# Patient Record
Sex: Female | Born: 1970 | Race: White | Hispanic: No | Marital: Married | Smoking: Never smoker
Health system: Southern US, Community
[De-identification: ages and names within clinical notes are randomized; demographics above are authoritative.]

## PROBLEM LIST (undated history)

## (undated) HISTORY — PX: INTRAUTERINE DEVICE INSERTION: SHX323

---

## 2002-01-07 ENCOUNTER — Inpatient Hospital Stay (HOSPITAL_COMMUNITY): Admission: AD | Admit: 2002-01-07 | Discharge: 2002-01-09 | Payer: Self-pay | Admitting: Gynecology

## 2002-02-17 ENCOUNTER — Other Ambulatory Visit: Admission: RE | Admit: 2002-02-17 | Discharge: 2002-02-17 | Payer: Self-pay | Admitting: *Deleted

## 2003-02-19 ENCOUNTER — Other Ambulatory Visit: Admission: RE | Admit: 2003-02-19 | Discharge: 2003-02-19 | Payer: Self-pay | Admitting: Gynecology

## 2004-02-27 ENCOUNTER — Other Ambulatory Visit: Admission: RE | Admit: 2004-02-27 | Discharge: 2004-02-27 | Payer: Self-pay | Admitting: Gynecology

## 2004-12-08 ENCOUNTER — Inpatient Hospital Stay (HOSPITAL_COMMUNITY): Admission: AD | Admit: 2004-12-08 | Discharge: 2004-12-10 | Payer: Self-pay | Admitting: Gynecology

## 2005-02-03 ENCOUNTER — Other Ambulatory Visit: Admission: RE | Admit: 2005-02-03 | Discharge: 2005-02-03 | Payer: Self-pay | Admitting: Gynecology

## 2006-02-04 ENCOUNTER — Other Ambulatory Visit: Admission: RE | Admit: 2006-02-04 | Discharge: 2006-02-04 | Payer: Self-pay | Admitting: Gynecology

## 2007-02-23 ENCOUNTER — Other Ambulatory Visit: Admission: RE | Admit: 2007-02-23 | Discharge: 2007-02-23 | Payer: Self-pay | Admitting: Gynecology

## 2008-02-28 ENCOUNTER — Other Ambulatory Visit: Admission: RE | Admit: 2008-02-28 | Discharge: 2008-02-28 | Payer: Self-pay | Admitting: Gynecology

## 2008-02-28 ENCOUNTER — Encounter: Payer: Self-pay | Admitting: Gynecology

## 2008-02-28 ENCOUNTER — Ambulatory Visit: Payer: Self-pay | Admitting: Gynecology

## 2008-11-29 ENCOUNTER — Ambulatory Visit: Payer: Self-pay | Admitting: Gynecology

## 2009-02-28 ENCOUNTER — Other Ambulatory Visit: Admission: RE | Admit: 2009-02-28 | Discharge: 2009-02-28 | Payer: Self-pay | Admitting: Gynecology

## 2009-02-28 ENCOUNTER — Encounter: Payer: Self-pay | Admitting: Gynecology

## 2009-02-28 ENCOUNTER — Ambulatory Visit: Payer: Self-pay | Admitting: Gynecology

## 2010-05-30 ENCOUNTER — Ambulatory Visit: Payer: Self-pay | Admitting: Gynecology

## 2010-05-30 ENCOUNTER — Other Ambulatory Visit
Admission: RE | Admit: 2010-05-30 | Discharge: 2010-05-30 | Payer: Self-pay | Source: Home / Self Care | Admitting: Gynecology

## 2010-11-07 NOTE — Discharge Summary (Signed)
   NAME:  [name], [name]                      ACCOUNT NO.:  [id number]   MEDICAL RECORD NO.:  [id number]                   PATIENT TYPE:  INP   LOCATION:  9125                                 FACILITY:  [facility]   PHYSICIAN:  Susa Loffler, P.A.              DATE OF BIRTH:  21-Feb-1971   DATE OF ADMISSION:  01/07/2002  DATE OF DISCHARGE:  01/09/2002                                 DISCHARGE SUMMARY   DISCHARGE DIAGNOSES:  Intrauterine pregnancy 39 weeks, delivered, thick  meconium, outlet forceps assisted vaginal delivery.   HISTORY OF PRESENT ILLNESS:  A 40 year old female gravida 2, para 0, aborta  1 with an EDC of January 14, 2002.  Prenatal course has been uncomplicated.   HOSPITAL COURSE:  On January 07, 2002 patient was admitted in labor and on January 07, 2002 at 1918 underwent an outlet forceps assisted vaginal delivery with  second degree midline episiotomy secondary to thick meconium and  nonreassuring fetal heart rate tracing.  It was noted there was a mild  caput.  There was a third degree with extension repaired.  Postpartum  patient did have some slight elevation of temperature but she was monitored  with no evidence of endometritis.  Therefore, patient was discharged to home  on January 09, 2002 in stable condition, voiding, afebrile.  She was given  [facility] Gynecologic postpartum surgical and postpartum booklet.  Subsequent __________ labs:  Patient's hemoglobin on January 08, 2002 was 9.2,  hematocrit 26.3.   DISCHARGE INSTRUCTIONS:  The patient was discharged to home.  Follow up in  six weeks.                                               Susa Loffler, P.A.    TSG/MEDQ  D:  02/03/2002  T:  02/03/2002  Job:  [id number]

## 2010-11-07 NOTE — H&P (Signed)
[facility]  Patient:    [name], [name] Visit Number: [id number] MRN: [id number]          Service Type: OBS Location: 910B [id number] [id number] Attending Physician:  Wetzel Bjornstad Dictated by:   Katy Fitch, M.D. Admit Date:  01/07/2002                           History and Physical  CHIEF COMPLAINT:              Labor.  HISTORY OF PRESENT ILLNESS:   This patient is a 40 year old, G2, P0, at 39 weeks, who presents to triage complaining of labor for the past three hours. The patient is noted to be intact and contracting every five minutes and noted to be 4 cm, 100%, and -1 station.  Heart tracings noted to be reactive.  The patient had late prenatal care with the practice; however, was taken care of in [place] prior to her moving.  The patients prenatal labs were normal.  PAST MEDICAL HISTORY:         None.  PAST SURGICAL HISTORY:        None.  MEDICATIONS:                  Prenatal vitamins.  ALLERGIES:                    None.  SOCIAL HISTORY:               Without any tobacco, alcohol, or drugs.  FAMILY HISTORY:               Without any mental retardation.  PHYSICAL EXAMINATION:  VITAL SIGNS:                  Blood pressure is 123/78.  HEENT:                        Sclerae clear.  LUNGS:                        Clear to auscultation bilaterally.  HEART:                        Regular rate and rhythm.  ABDOMEN:                      Gravid and nontender.  Estimated fetal weight 7 pounds 12 ounces.  EXTREMITIES:                  Without any edema, clubbing, cyanosis, or tenderness.  PELVIC:                       Cervix in OB room, 8, 100%, and 0 station.  Took ______.  Fetal heart tones 130s and reactive.  ASSESSMENT AND PLAN:          1. Artificial rupture of membranes with                                  meconium.                               2. Intrauterine pressure catheter placed with  amnioinfusion  300 cc bolus with 150 an hour.                               3. Epidural in place.                               4. Anticipate spontaneous vaginal delivery. Dictated by:   Katy Fitch, M.D. Attending Physician:  Wetzel Bjornstad DD:  01/07/02 TD:  01/07/02 Job: [id number]

## 2010-11-07 NOTE — H&P (Signed)
[name], [name]            ACCOUNT NO.:  [id number]   MEDICAL RECORD NO.:  [id number]          PATIENT TYPE:  INP   LOCATION:  9165                          FACILITY:  [facility]   PHYSICIAN:  Juan H. Lily Peer, M.D.DATE OF BIRTH:  05-26-1971   DATE OF ADMISSION:  12/08/2004  DATE OF DISCHARGE:                                HISTORY & PHYSICAL   CHIEF COMPLAINT:  Term intrauterine pregnancy, 37 to 58 weeks estimated  gestational age with past pregnancy history fetal macrosomia.   HISTORY OF PRESENT ILLNESS:  The patient is a 40 year old gravida 2, para 1  at 37-1/2 weeks estimated gestational age who is being admitted for elective  induction due to the fact that she has a favorable cervix in the office  where she was 2 to 3 cm dilated, about 80% effaced and also patient with  previous pregnancy with fetal macrosomia at 9-1/2 pounds at [redacted] weeks  gestation.   OBSTETRICAL HISTORY:  The patient's prenatal course has essentially been  unremarkable although she did decline any cystic fibrosis screening or  maternal serum alpha fetoprotein screening.  She had normal diabetes  screening and had gained approximately 20 pounds in her pregnancy.   PAST MEDICAL HISTORY:  Patient with prior normal spontaneous vaginal  delivery in July 2003, 9-1/2 pounds, female at [redacted] weeks gestation.  Otherwise patient has been healthy with no reported medical problems.   ALLERGIES:  Patient denies any allergies.   REVIEW OF SYMPTOMS:  See [employer] form.   PHYSICAL EXAMINATION:  GENERAL APPEARANCE:  Well-developed, well-nourished  female.  HEENT:  Unremarkable.  NECK:  Supple. Trachea midline.  No carotid bruits and no thyromegaly.  LUNGS:  Are clear to auscultation without rhonchi or wheezes.  HEART:  Regular rate and rhythm without murmurs, rubs or gallops.  BREAST:  Examination not done.  ABDOMEN:  Gravid uterus, vertex presentation by [employer].  PELVIC:  Cervix 4+ cm. 80 to 90% effaced.   Minus 2 station.  Intact  membrane.  EXTREMITIES:  Deep tendon reflexes 1+. Negative clonus   LABORATORY DATA:  Prenatal labs show O positive blood type, negative  antibody screen.  VDRL nonreactive. Rubella immune. Hepatitis B surface  antigen and HIV were negative.  Pap smear was normal.  Diabetes screen was  normal.  Declined cystic fibrosis screening.  Declined serum alpha  fetoprotein.  GBS culture was negative.   ASSESSMENT:  40 year old gravida 2, para 1 at 37-1/2 weeks estimated  gestational age with advanced cervical dilatation and favorable cervix is  admitted for induction.  Patient with prior pregnancy at [redacted] weeks gestation  who delivered a 9-1/2 pound baby.  Patient was anxious and apprehensive and  concerned about a fetal macrosomia with this pregnancy and since she has a  favorable cervix she was brought in for induction.  This morning she was  started on Pitocin.  She is currently on 4 mL international units of  Pitocin, contacting every two to three minutes apart with a reassuring fetal  heart rate tracing.  She underwent artificial rupture of membranes with  clear amniotic pressure.  Intrauterine pressure  catheter and fetal scalp electrode were placed.  Patient will receive an epidural for pain relief and anticipate a vaginal  delivery.  Her vital signs today were temperature 98.3 and blood pressure  104/72.   PLAN:  As per assessment above.       JHF/MEDQ  D:  12/08/2004  T:  12/08/2004  Job:  [id number]

## 2011-01-29 DIAGNOSIS — L723 Sebaceous cyst: Secondary | ICD-10-CM | POA: Insufficient documentation

## 2011-10-26 ENCOUNTER — Encounter: Payer: Self-pay | Admitting: *Deleted

## 2011-11-02 ENCOUNTER — Encounter: Payer: Self-pay | Admitting: Gynecology

## 2011-11-02 ENCOUNTER — Other Ambulatory Visit (HOSPITAL_COMMUNITY)
Admission: RE | Admit: 2011-11-02 | Discharge: 2011-11-02 | Disposition: A | Discharge status: Home / Self Care | Payer: BC Managed Care – PPO | Source: Ambulatory Visit | Attending: Gynecology | Admitting: Gynecology

## 2011-11-02 ENCOUNTER — Ambulatory Visit (INDEPENDENT_AMBULATORY_CARE_PROVIDER_SITE_OTHER): Payer: BC Managed Care – PPO | Admitting: Gynecology

## 2011-11-02 VITALS — BP 104/64 | Ht 65.0 in | Wt 135.0 lb

## 2011-11-02 DIAGNOSIS — Z01419 Encounter for gynecological examination (general) (routine) without abnormal findings: Secondary | ICD-10-CM

## 2011-11-02 DIAGNOSIS — Z13220 Encounter for screening for lipoid disorders: Secondary | ICD-10-CM

## 2011-11-02 DIAGNOSIS — Z131 Encounter for screening for diabetes mellitus: Secondary | ICD-10-CM

## 2011-11-02 DIAGNOSIS — Z1159 Encounter for screening for other viral diseases: Secondary | ICD-10-CM | POA: Insufficient documentation

## 2011-11-02 LAB — LIPID PANEL
LDL Cholesterol: 107 mg/dL — ABNORMAL HIGH (ref 0–99)
Triglycerides: 66 mg/dL (ref <150)
VLDL: 13 mg/dL (ref 0–40)

## 2011-11-02 LAB — CBC WITH DIFFERENTIAL/PLATELET
Basophils Absolute: 0 10*3/uL (ref 0.0–0.1)
Basophils Relative: 0 % (ref 0–1)
Eosinophils Absolute: 0 10*3/uL (ref 0.0–0.7)
Eosinophils Relative: 1 % (ref 0–5)
HCT: 40.4 % (ref 36.0–46.0)
MCHC: 32.9 g/dL (ref 30.0–36.0)
MCV: 97.6 fL (ref 78.0–100.0)
Monocytes Absolute: 0.3 10*3/uL (ref 0.1–1.0)
RDW: 13 % (ref 11.5–15.5)

## 2011-11-02 NOTE — Progress Notes (Signed)
KYRI SHADER 1970-08-05 [id number]        41 y.o.  for annual exam.  Doing well.  Past medical history,surgical history, medications, allergies, family history and social history were all reviewed and documented in the EPIC chart. ROS:  Was performed and pertinent positives and negatives are included in the history.  Exam: Sherrilyn Rist chaperone present Filed Vitals:   11/02/11 0919  BP: 104/64   General appearance  Normal Skin grossly normal Head/Neck normal with no cervical or supraclavicular adenopathy thyroid normal Lungs  clear Cardiac RR, without RMG Abdominal  soft, nontender, without masses, organomegaly or hernia Breasts  examined lying and sitting without masses, retractions, discharge or axillary adenopathy. Pelvic  Ext/BUS/vagina  normal   Cervix  normal IUD string visualized, Pap done  Uterus  anteverted, normal size, shape and contour, midline and mobile nontender   Adnexa  Without masses or tenderness    Anus and perineum  normal   Rectovaginal  normal sphincter tone without palpated masses or tenderness.    Assessment/Plan:  41 y.o. female for annual exam.    1. Mirena IUD. Placed October 2008. Due to be replaced this coming fall. I reminded the patient to schedule this. Having light regular menses. IUD string visualized. 2. Pap smear. I did a Pap with high-risk HPV screen. Assuming negative, with no history of abnormal Pap smears before, we will plan every 5 year screening. 3. Breast health. SBE monthly reviewed. Screening mammogram recommendations between 35 and 40 reviewed. She has no strong family history and prefers to wait closer to 40. 4. Health maintenance. Baseline CBC lipid profile glucose urinalysis ordered.    Dara Lords MD, 9:43 AM 11/02/2011

## 2011-11-02 NOTE — Patient Instructions (Signed)
Returned by October 2013 for IUD replacement.

## 2011-11-03 LAB — URINALYSIS W MICROSCOPIC + REFLEX CULTURE
Bacteria, UA: NONE SEEN
Crystals: NONE SEEN
Ketones, ur: NEGATIVE mg/dL
Nitrite: NEGATIVE
Specific Gravity, Urine: 1.023 (ref 1.005–1.030)
Urobilinogen, UA: 0.2 mg/dL (ref 0.0–1.0)

## 2012-05-22 HISTORY — PX: INTRAUTERINE DEVICE INSERTION: SHX323

## 2012-06-07 ENCOUNTER — Telehealth: Payer: Self-pay | Admitting: Gynecology

## 2012-06-07 ENCOUNTER — Other Ambulatory Visit: Payer: Self-pay | Admitting: Gynecology

## 2012-06-07 DIAGNOSIS — Z3049 Encounter for surveillance of other contraceptives: Secondary | ICD-10-CM

## 2012-06-07 MED ORDER — LEVONORGESTREL 20 MCG/24HR IU IUD: INTRAUTERINE_SYSTEM | Freq: Once | INTRAUTERINE | Status: DC

## 2012-06-07 NOTE — Telephone Encounter (Signed)
Pt was advised that the removal and insertion of new Mirena is covd at 100% by her ins,no copay. She has appt with TF on 06/09/12/WL

## 2012-06-09 ENCOUNTER — Encounter: Payer: Self-pay | Admitting: Gynecology

## 2012-06-09 ENCOUNTER — Ambulatory Visit (INDEPENDENT_AMBULATORY_CARE_PROVIDER_SITE_OTHER): Payer: BC Managed Care – PPO | Admitting: Gynecology

## 2012-06-09 DIAGNOSIS — Z30431 Encounter for routine checking of intrauterine contraceptive device: Secondary | ICD-10-CM

## 2012-06-09 MED ORDER — LEVONORGESTREL 20 MCG/24HR IU IUD: INTRAUTERINE_SYSTEM | Freq: Once | INTRAUTERINE | Status: DC

## 2012-06-09 NOTE — Patient Instructions (Signed)
Intrauterine Device Insertion Most often, an intrauterine device (IUD) is inserted into the uterus to prevent pregnancy. There are 2 types of IUDs available:  Copper IUD. This type of IUD creates an environment that is not favorable to sperm survival. The mechanism of action of the copper IUD is not known for certain. It can stay in place for 10 years.  Hormone IUD. This type of IUD contains the hormone progestin (synthetic progesterone). The progestin thickens the cervical mucus and prevents sperm from entering the uterus, and it also thins the uterine lining. There is no evidence that the hormone IUD prevents implantation. The hormone IUD can stay in place for up to 5 years. An IUD is the most cost-effective birth control if left in place for the full duration. It may be removed at any time. LET YOUR CAREGIVER KNOW ABOUT:  Sensitivity to metals.  Medicines taken including herbs, eyedrops, over-the-counter medicines, and creams.  Use of steroids (by mouth or creams).  Previous problems with anesthetics or numbing medicine.  Previous gynecological surgery.  History of blood clots or clotting disorders.  Possibility of pregnancy.  Menstrual irregularities.  Concerns regarding unusual vaginal discharge or odors.  Previous experience with an IUD.  Other health problems. RISKS AND COMPLICATIONS  Accidental puncture (perforation) of the uterus.  Accidental placement of the IUD either in the muscle layer of the uterus (myometrium) or outside the uterus. If this happen, the IUD can be found essentially floating around the bowels. When this happens, the IUD must be taken out surgically.  The IUD may fall out of the uterus (expulsion). This is more common in women who have recently had a child.   Pregnancy in the fallopian tube (ectopic). BEFORE THE PROCEDURE  Schedule the IUD insertion for when you will have your menstrual period or right after, to make sure you are not pregnant.  Placement of the IUD is better tolerated shortly after a menstrual cycle.  You may need to take tests or be examined to make sure you are not pregnant.  You may be required to take a pregnancy test.  You may be required to get checked for sexually transmitted infections (STIs) prior to placement. Placing an IUD in someone who has an infection can make an infection worse.  You may be given a pain reliever to take 1 or 2 hours before the procedure.  An exam will be performed to determine the size and position of your uterus.  Ask your caregiver about changing or stopping your regular medicines. PROCEDURE   A tool (speculum) is placed in the vagina. This allows your caregiver to see the lower part of the uterus (cervix).  The cervix is prepped with a medicine that lowers the risk of infection.  You may be given a medicine to numb each side of the cervix (intracervical or paracervical block). This is used to block and control any discomfort with insertion.  A tool (uterine sound) is inserted into the uterus to determine the length of the uterine cavity and the direction the uterus may be tilted.  A slim instrument (IUD inserter) is inserted through the cervical canal and into your uterus.  The IUD is placed in the uterine cavity and the insertion device is removed.  The nylon string that is attached to the IUD, and used for eventual IUD removal, is trimmed. It is trimmed so that it lays high in the vagina, just outside the cervix. AFTER THE PROCEDURE  You may have bleeding after the  attached to the IUD, and used for eventual IUD removal, is trimmed. It is trimmed so that it lays high in the vagina, just outside the cervix.  AFTER THE PROCEDURE  · You may have bleeding after the procedure. This is normal. It varies from light spotting for a few days to menstrual-like bleeding.   · You may have mild cramping.  · Practice checking the string coming out of the cervix to make sure the IUD remains in the uterus. If you cannot feel the string, you should schedule a "string check" with your caregiver.  · If you had a hormone IUD inserted, expect that your period may be lighter or nonexistent  within a year's time (though this is not always the case). There may be delayed fertility with the hormone IUD as a result of its progesterone effect. When you are ready to become pregnant, it is suggested to have the IUD removed up to 1 year in advance.  · Yearly exams are advised.  Document Released: 02/04/2011 Document Revised: 08/31/2011 Document Reviewed: 02/04/2011  ExitCare® Patient Information ©2013 [employer].

## 2012-06-09 NOTE — Progress Notes (Signed)
Patient presents for Mirena IUD Removal and replacement. She has read through the booklet, has no contraindications and signed the consent form. She is at the five-year mark with her current Mirena IUD.  I reviewed the removal and insertional process with her as well as the risks to include infection either immediate or long-term, uterine perforation or migration requiring surgery to remove, other complications such as pain, hormonal side effects and possibilities of failure with subsequent pregnancy.    Exam with Kim assistant Pelvic: External BUS vagina normal. Cervix normal with IUD string visualized. Uterus axial normal size shape contour midline mobile nontender. Adnexa without masses or tenderness.  Procedure: The cervix was cleansed with Betadine and the IUD string was grasped with a [place] forcep and the old Mirena IUD removed, shown to the patient and discarded. The anterior lip of the cervix was grasped with a single-tooth tenaculum, the uterus was sounded and a new Mirena IUD was placed according to manufacturer's recommendations without difficulty. The strings were trimmed. The patient tolerated well and will follow up in one month for a postinsertional check.  Lot number:  [id number]

## 2012-06-10 ENCOUNTER — Other Ambulatory Visit: Payer: Self-pay | Admitting: *Deleted

## 2012-06-10 DIAGNOSIS — Z30431 Encounter for routine checking of intrauterine contraceptive device: Secondary | ICD-10-CM

## 2012-07-11 ENCOUNTER — Ambulatory Visit (INDEPENDENT_AMBULATORY_CARE_PROVIDER_SITE_OTHER): Payer: BC Managed Care – PPO | Admitting: Gynecology

## 2012-07-11 ENCOUNTER — Encounter: Payer: Self-pay | Admitting: Gynecology

## 2012-07-11 DIAGNOSIS — Z30431 Encounter for routine checking of intrauterine contraceptive device: Secondary | ICD-10-CM

## 2012-07-11 NOTE — Patient Instructions (Signed)
Follow up in May for your annual exam. Sooner if any problems.

## 2012-07-11 NOTE — Progress Notes (Signed)
Patient presents for Mirena IUD follow up placement exam. Doing well without complaints. Menses started last night. Had intercourse without difficulty.   Exam with Kim assistant Abdomen soft nontender without masses guarding rebound organomegaly. Pelvic external BUS vagina normal. Cervix normal with IUD string visualized and appropriate length. Uterus normal size midline mobile nontender. Adnexa without masses or tenderness.  Assessment and plan: Normal IUD check doing well. Will keep menstrual calendar. Assuming she continues well she'll follow up in May when she is due for her annual, sooner as needed.

## 2013-01-09 ENCOUNTER — Ambulatory Visit (INDEPENDENT_AMBULATORY_CARE_PROVIDER_SITE_OTHER): Payer: BC Managed Care – PPO | Admitting: Women's Health

## 2013-01-09 ENCOUNTER — Encounter: Payer: Self-pay | Admitting: Women's Health

## 2013-01-09 DIAGNOSIS — N898 Other specified noninflammatory disorders of vagina: Secondary | ICD-10-CM

## 2013-01-09 DIAGNOSIS — R350 Frequency of micturition: Secondary | ICD-10-CM

## 2013-01-09 DIAGNOSIS — L293 Anogenital pruritus, unspecified: Secondary | ICD-10-CM

## 2013-01-09 LAB — URINALYSIS W MICROSCOPIC + REFLEX CULTURE
Bilirubin Urine: NEGATIVE
Nitrite: NEGATIVE
Protein, ur: NEGATIVE mg/dL
Urobilinogen, UA: 1 mg/dL (ref 0.0–1.0)

## 2013-01-09 LAB — WET PREP FOR TRICH, YEAST, CLUE
Clue Cells Wet Prep HPF POC: NONE SEEN
Trich, Wet Prep: NONE SEEN

## 2013-01-09 MED ORDER — FLUCONAZOLE 150 MG PO TABS: 150.0000 mg | ORAL_TABLET | Freq: Once | ORAL | Status: DC

## 2013-01-09 MED ORDER — SULFAMETHOXAZOLE-TRIMETHOPRIM 800-160 MG PO TABS: 1.0000 | ORAL_TABLET | Freq: Two times a day (BID) | ORAL | Status: DC

## 2013-01-09 NOTE — Progress Notes (Signed)
Patient ID: [name], female   DOB: 1970/06/29, 42 y.o.   MRN: [id number] Presents with complaint of increase pain especially the end of stream of urination and some back discomfort. Vaginal itching, used over-the-counter Monistat several days ago but has continued with vaginal discomfort. Denies frequency, fever. Amenorrheic with Mirena IUD.   Exam: Appears well. No CVAT. External genitalia extremely erythematous at introitus, speculum exam moderate amount of a curdy discharge noted vaginal walls also erythematous.IUD string visible. Wet prep negative with notation of gel/cream obscuring sample. UA: Trace blood, large leukocytes, 21-50 WBCs, few bacteria.  Clinical yeast UTI  Plan: Diflucan 150 by mouth x1 dose. Septra DS twice daily for 3 days #6. Urine culture pending. UTI and yeast prevention discussed. Instructed to call if no relief of symptoms.

## 2013-01-09 NOTE — Patient Instructions (Addendum)
Urinary Tract Infection  Urinary tract infections (UTIs) can develop anywhere along your urinary tract. Your urinary tract is your body's drainage system for removing wastes and extra water. Your urinary tract includes two kidneys, two ureters, a bladder, and a urethra. Your kidneys are a pair of bean-shaped organs. Each kidney is about the size of your fist. They are located below your ribs, one on each side of your spine.  CAUSES  Infections are caused by microbes, which are microscopic organisms, including fungi, viruses, and bacteria. These organisms are so small that they can only be seen through a microscope. Bacteria are the microbes that most commonly cause UTIs.  SYMPTOMS   Symptoms of UTIs may vary by age and gender of the patient and by the location of the infection. Symptoms in Arisbel Maione women typically include a frequent and intense urge to urinate and a painful, burning feeling in the bladder or urethra during urination. Older women and men are more likely to be tired, shaky, and weak and have muscle aches and abdominal pain. A fever may mean the infection is in your kidneys. Other symptoms of a kidney infection include pain in your back or sides below the ribs, nausea, and vomiting.  DIAGNOSIS  To diagnose a UTI, your caregiver will ask you about your symptoms. Your caregiver also will ask to provide a urine sample. The urine sample will be tested for bacteria and white blood cells. White blood cells are made by your body to help fight infection.  TREATMENT   Typically, UTIs can be treated with medication. Because most UTIs are caused by a bacterial infection, they usually can be treated with the use of antibiotics. The choice of antibiotic and length of treatment depend on your symptoms and the type of bacteria causing your infection.  HOME CARE INSTRUCTIONS   If you were prescribed antibiotics, take them exactly as your caregiver instructs you. Finish the medication even if you feel better after you  have only taken some of the medication.   Drink enough water and fluids to keep your urine clear or pale yellow.   Avoid caffeine, tea, and carbonated beverages. They tend to irritate your bladder.   Empty your bladder often. Avoid holding urine for long periods of time.   Empty your bladder before and after sexual intercourse.   After a bowel movement, women should cleanse from front to back. Use each tissue only once.  SEEK MEDICAL CARE IF:    You have back pain.   You develop a fever.   Your symptoms do not begin to resolve within 3 days.  SEEK IMMEDIATE MEDICAL CARE IF:    You have severe back pain or lower abdominal pain.   You develop chills.   You have nausea or vomiting.   You have continued burning or discomfort with urination.  MAKE SURE YOU:    Understand these instructions.   Will watch your condition.   Will get help right away if you are not doing well or get worse.  Document Released: 03/18/2005 Document Revised: 12/08/2011 Document Reviewed: 07/17/2011  ExitCare Patient Information 2014 [employer].

## 2013-01-11 LAB — URINE CULTURE: Organism ID, Bacteria: NO GROWTH

## 2013-01-27 ENCOUNTER — Encounter: Payer: Self-pay | Admitting: Women's Health

## 2013-01-27 ENCOUNTER — Ambulatory Visit (INDEPENDENT_AMBULATORY_CARE_PROVIDER_SITE_OTHER): Payer: BC Managed Care – PPO | Admitting: Women's Health

## 2013-01-27 VITALS — BP 110/70 | Ht 65.0 in | Wt 136.0 lb

## 2013-01-27 DIAGNOSIS — Z833 Family history of diabetes mellitus: Secondary | ICD-10-CM

## 2013-01-27 DIAGNOSIS — Z13220 Encounter for screening for lipoid disorders: Secondary | ICD-10-CM

## 2013-01-27 DIAGNOSIS — Z01419 Encounter for gynecological examination (general) (routine) without abnormal findings: Secondary | ICD-10-CM

## 2013-01-27 LAB — CBC WITH DIFFERENTIAL/PLATELET
Basophils Absolute: 0 10*3/uL (ref 0.0–0.1)
Eosinophils Relative: 1 % (ref 0–5)
Lymphocytes Relative: 22 % (ref 12–46)
Neutro Abs: 3.2 10*3/uL (ref 1.7–7.7)
Neutrophils Relative %: 68 % (ref 43–77)
Platelets: 204 10*3/uL (ref 150–400)
RDW: 13.3 % (ref 11.5–15.5)
WBC: 4.7 10*3/uL (ref 4.0–10.5)

## 2013-01-27 LAB — LIPID PANEL
Cholesterol: 153 mg/dL (ref 0–200)
HDL: 41 mg/dL (ref >39)
Triglycerides: 97 mg/dL (ref <150)

## 2013-01-27 LAB — GLUCOSE, RANDOM: Glucose, Bld: 74 mg/dL (ref 70–99)

## 2013-01-27 NOTE — Progress Notes (Signed)
[name] 1970/07/18 [id number]    History:    The patient presents for annual exam.  Mirena IUD placed 05/2012 with infrequent spotting for 2 days monthly. Normal Pap history.   Past medical history, past surgical history, family history and social history were all reviewed and documented in the EPIC chart. 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 [name],[employer] 8 both doing well. Paternal aunt and paternal grandmother breast cancer.   ROS:  A  ROS was performed and pertinent positives and negatives are included in the history.  Exam:  Filed Vitals:   01/27/13 1128  BP: 110/70    General appearance:  Normal Head/Neck:  Normal, without cervical or supraclavicular adenopathy. Thyroid:  Symmetrical, normal in size, without palpable masses or nodularity. Respiratory  Effort:  Normal  Auscultation:  Clear without wheezing or rhonchi Cardiovascular  Auscultation:  Regular rate, without rubs, murmurs or gallops  Edema/varicosities:  Not grossly evident Abdominal  Soft,nontender, without masses, guarding or rebound.  Liver/spleen:  No organomegaly noted  Hernia:  None appreciated  Skin  Inspection:  Grossly normal  Palpation:  Grossly normal Neurologic/psychiatric  Orientation:  Normal with appropriate conversation.  Mood/affect:  Normal  Genitourinary    Breasts: Examined lying and sitting.     Right: Without masses, retractions, discharge or axillary adenopathy.     Left: Without masses, retractions, discharge or axillary adenopathy.   Inguinal/mons:  Normal without inguinal adenopathy  External genitalia:  Normal  BUS/Urethra/Skene's glands:  Normal  Bladder:  Normal  Vagina:  Normal  Cervix:  Normal IUD strings visible  Uterus:   normal in size, shape and contour.  Midline and mobile  Adnexa/parametria:     Rt: Without masses or tenderness.   Lt: Without masses or tenderness.  Anus and perineum: Normal  Digital rectal exam: Normal sphincter tone without  palpated masses or tenderness  Assessment/Plan:  42 y.o. M WF G2 P2 for annual exam with no complaints.  Normal GYN exam Mirena IUD 05/2012  Plan: SBE's, annual mammogram, instructed to schedule. Continue healthy lifestyle of diet and exercise. Vitamin D 1000 daily encouraged, aware Mirena IUD as could for 5 years. CBC, glucose, lipid panel, UA, Pap normal with negative HR HPV 2013, new screening guidelines reviewed.    Harrington Challenger [facility], 2:17 PM 01/27/2013

## 2013-01-27 NOTE — Patient Instructions (Addendum)

## 2013-01-28 LAB — URINALYSIS W MICROSCOPIC + REFLEX CULTURE
Bacteria, UA: NONE SEEN
Casts: NONE SEEN
Glucose, UA: NEGATIVE mg/dL
Hgb urine dipstick: NEGATIVE
Ketones, ur: NEGATIVE mg/dL
Protein, ur: NEGATIVE mg/dL
pH: 6 (ref 5.0–8.0)

## 2014-04-23 ENCOUNTER — Encounter: Payer: Self-pay | Admitting: Women's Health

## 2015-11-04 ENCOUNTER — Encounter: Payer: Self-pay | Admitting: Gynecology

## 2015-11-04 ENCOUNTER — Ambulatory Visit (INDEPENDENT_AMBULATORY_CARE_PROVIDER_SITE_OTHER): Payer: BLUE CROSS/BLUE SHIELD | Admitting: Gynecology

## 2015-11-04 VITALS — BP 114/62

## 2015-11-04 DIAGNOSIS — N898 Other specified noninflammatory disorders of vagina: Secondary | ICD-10-CM | POA: Diagnosis not present

## 2015-11-04 LAB — WET PREP FOR TRICH, YEAST, CLUE
CLUE CELLS WET PREP: NONE SEEN
TRICH WET PREP: NONE SEEN
Yeast Wet Prep HPF POC: NONE SEEN

## 2015-11-04 MED ORDER — METRONIDAZOLE 500 MG PO TABS: 500.0000 mg | ORAL_TABLET | Freq: Two times a day (BID) | ORAL | Status: DC

## 2015-11-04 MED ORDER — FLUCONAZOLE 150 MG PO TABS: 150.0000 mg | ORAL_TABLET | Freq: Once | ORAL | Status: DC

## 2015-11-04 NOTE — Patient Instructions (Addendum)
Take the metronidazole antibiotic twice daily for 7 days. Avoid alcohol while taking. Take one Diflucan pill at the beginning of the antibiotic course and one at the end of the antibiotic course. Follow up if your symptoms persist, worsen or recur. Follow up for your annual exam when due.

## 2015-11-04 NOTE — Addendum Note (Signed)
Addended by: Dayna BarkerGARDNER, Alphonsa Brickle K on: 11/04/2015 11:41 AM   Modules accepted: Orders

## 2015-11-04 NOTE — Progress Notes (Signed)
    Oscar LaClaire G Hara 10/01/1970 [id number]        45 y.o.  [id number] Presents with a 5 day history of vaginal irritation. Used an OTC antifungal thinking it was yeast without relief. No significant discharge or itching but mostly just a general irritation. No odor. No urinary symptoms such as frequency dysuria or urgency low back pain fever or chills.  Past medical history,surgical history, problem list, medications, allergies, family history and social history were all reviewed and documented in the EPIC chart.  Directed ROS with pertinent positives and negatives documented in the history of present illness/assessment and plan.  Exam: Kennon PortelaKim Gardner assistant Filed Vitals:   11/04/15 1048  BP: 114/62   General appearance:  Normal Abdomen soft nontender without masses guarding rebound Pelvic external BUS vagina with cakey white discharge. Cervix normal. Uterus normal size midline mobile nontender. Adnexa without masses or tenderness.   Assessment/Plan:  45 y.o. [id number] with history and exam as above. Wet prep is unremarkable. I reviewed with her this could be a partially treated yeast since she did use the cream or an underlying bacterial vaginosis that now is difficult to diagnose due to the cream. I'm going to cover her with Flagyl 500 mg twice a day 7 days at her request. Alcohol avoidance reviewed. Diflucan 150 mg at the beginning of the Flagyl and one at the end of the Flagyl. Follow up if symptoms persist, worsen or recur.    Dara LordsFONTAINE,Braeden Dolinski P MD, 11:00 AM 11/04/2015

## 2015-12-19 ENCOUNTER — Ambulatory Visit (INDEPENDENT_AMBULATORY_CARE_PROVIDER_SITE_OTHER): Payer: BLUE CROSS/BLUE SHIELD | Admitting: Women's Health

## 2015-12-19 ENCOUNTER — Encounter: Payer: Self-pay | Admitting: Women's Health

## 2015-12-19 VITALS — BP 124/80 | Ht 65.0 in | Wt 139.0 lb

## 2015-12-19 DIAGNOSIS — Z01419 Encounter for gynecological examination (general) (routine) without abnormal findings: Secondary | ICD-10-CM | POA: Diagnosis not present

## 2015-12-19 DIAGNOSIS — Z13220 Encounter for screening for lipoid disorders: Secondary | ICD-10-CM

## 2015-12-19 NOTE — Patient Instructions (Signed)
Mammogram breast center [phone]   Health Maintenance, Female Adopting a healthy lifestyle and getting preventive care can go a long way to promote health and wellness. Talk with your health care provider about what schedule of regular examinations is right for you. This is a good chance for you to check in with your provider about disease prevention and staying healthy. In between checkups, there are plenty of things you can do on your own. Experts have done a lot of research about which lifestyle changes and preventive measures are most likely to keep you healthy. Ask your health care provider for more information. WEIGHT AND DIET  Eat a healthy diet  Be sure to include plenty of vegetables, fruits, low-fat dairy products, and lean protein.  Do not eat a lot of foods high in solid fats, added sugars, or salt.  Get regular exercise. This is one of the most important things you can do for your health.  Most adults should exercise for at least 150 minutes each week. The exercise should increase your heart rate and make you sweat (moderate-intensity exercise).  Most adults should also do strengthening exercises at least twice a week. This is in addition to the moderate-intensity exercise.  Maintain a healthy weight  Body mass index (BMI) is a measurement that can be used to identify possible weight problems. It estimates body fat based on height and weight. Your health care provider can help determine your BMI and help you achieve or maintain a healthy weight.  For females 15 years of age and older:   A BMI below 18.5 is considered underweight.  A BMI of 18.5 to 24.9 is normal.  A BMI of 25 to 29.9 is considered overweight.  A BMI of 30 and above is considered obese.  Watch levels of cholesterol and blood lipids  You should start having your blood tested for lipids and cholesterol at 43 years of age, then have this test every 5 years.  You may need to have your cholesterol levels  checked more often if:  Your lipid or cholesterol levels are high.  You are older than 43 years of age.  You are at high risk for heart disease.  CANCER SCREENING   Lung Cancer  Lung cancer screening is recommended for adults 43-78 years old who are at high risk for lung cancer because of a history of smoking.  A yearly low-dose CT scan of the lungs is recommended for people who:  Currently smoke.  Have quit within the past 15 years.  Have at least a 30-pack-year history of smoking. A pack year is smoking an average of one pack of cigarettes a day for 1 year.  Yearly screening should continue until it has been 15 years since you quit.  Yearly screening should stop if you develop a health problem that would prevent you from having lung cancer treatment.  Breast Cancer  Practice breast self-awareness. This means understanding how your breasts normally appear and feel.  It also means doing regular breast self-exams. Let your health care provider know about any changes, no matter how small.  If you are in your 20s or 30s, you should have a clinical breast exam (CBE) by a health care provider every 1-3 years as part of a regular health exam.  If you are 45 or older, have a CBE every year. Also consider having a breast X-ray (mammogram) every year.  If you have a family history of breast cancer, talk to your health care provider  about genetic screening.  If you are at high risk for breast cancer, talk to your health care provider about having an MRI and a mammogram every year.  Breast cancer gene (BRCA) assessment is recommended for women who have family members with BRCA-related cancers. BRCA-related cancers include:  Breast.  Ovarian.  Tubal.  Peritoneal cancers.  Results of the assessment will determine the need for genetic counseling and BRCA1 and BRCA2 testing. Cervical Cancer Your health care provider may recommend that you be screened regularly for cancer of the  pelvic organs (ovaries, uterus, and vagina). This screening involves a pelvic examination, including checking for microscopic changes to the surface of your cervix (Pap test). You may be encouraged to have this screening done every 3 years, beginning at age 44.  For women ages 10-65, health care providers may recommend pelvic exams and Pap testing every 3 years, or they may recommend the Pap and pelvic exam, combined with testing for human papilloma virus (HPV), every 5 years. Some types of HPV increase your risk of cervical cancer. Testing for HPV may also be done on women of any age with unclear Pap test results.  Other health care providers may not recommend any screening for nonpregnant women who are considered low risk for pelvic cancer and who do not have symptoms. Ask your health care provider if a screening pelvic exam is right for you.  If you have had past treatment for cervical cancer or a condition that could lead to cancer, you need Pap tests and screening for cancer for at least 20 years after your treatment. If Pap tests have been discontinued, your risk factors (such as having a new sexual partner) need to be reassessed to determine if screening should resume. Some women have medical problems that increase the chance of getting cervical cancer. In these cases, your health care provider may recommend more frequent screening and Pap tests. Colorectal Cancer  This type of cancer can be detected and often prevented.  Routine colorectal cancer screening usually begins at 43 years of age and continues through 43 years of age.  Your health care provider may recommend screening at an earlier age if you have risk factors for colon cancer.  Your health care provider may also recommend using home test kits to check for hidden blood in the stool.  A small camera at the end of a tube can be used to examine your colon directly (sigmoidoscopy or colonoscopy). This is done to check for the earliest  forms of colorectal cancer.  Routine screening usually begins at age 64.  Direct examination of the colon should be repeated every 5-10 years through 43 years of age. However, you may need to be screened more often if early forms of precancerous polyps or small growths are found. Skin Cancer  Check your skin from head to toe regularly.  Tell your health care provider about any new moles or changes in moles, especially if there is a change in a mole's shape or color.  Also tell your health care provider if you have a mole that is larger than the size of a pencil eraser.  Always use sunscreen. Apply sunscreen liberally and repeatedly throughout the day.  Protect yourself by wearing long sleeves, pants, a wide-brimmed hat, and sunglasses whenever you are outside. HEART DISEASE, DIABETES, AND HIGH BLOOD PRESSURE   High blood pressure causes heart disease and increases the risk of stroke. High blood pressure is more likely to develop in:  People who have  blood pressure in the high end of the normal range (130-139/85-89 mm Hg).  People who are overweight or obese.  People who are African American.  If you are 88-65 years of age, have your blood pressure checked every 3-5 years. If you are 42 years of age or older, have your blood pressure checked every year. You should have your blood pressure measured twice--once when you are at a hospital or clinic, and once when you are not at a hospital or clinic. Record the average of the two measurements. To check your blood pressure when you are not at a hospital or clinic, you can use:  An automated blood pressure machine at a pharmacy.  A home blood pressure monitor.  If you are between 73 years and 22 years old, ask your health care provider if you should take aspirin to prevent strokes.  Have regular diabetes screenings. This involves taking a blood sample to check your fasting blood sugar level.  If you are at a normal weight and have a low  risk for diabetes, have this test once every three years after 43 years of age.  If you are overweight and have a high risk for diabetes, consider being tested at a younger age or more often. PREVENTING INFECTION  Hepatitis B  If you have a higher risk for hepatitis B, you should be screened for this virus. You are considered at high risk for hepatitis B if:  You were born in a country where hepatitis B is common. Ask your health care provider which countries are considered high risk.  Your parents were born in a high-risk country, and you have not been immunized against hepatitis B (hepatitis B vaccine).  You have HIV or AIDS.  You use needles to inject street drugs.  You live with someone who has hepatitis B.  You have had sex with someone who has hepatitis B.  You get hemodialysis treatment.  You take certain medicines for conditions, including cancer, organ transplantation, and autoimmune conditions. Hepatitis C  Blood testing is recommended for:  Everyone born from [birth year] through 1963.  Anyone with known risk factors for hepatitis C. Sexually transmitted infections (STIs)  You should be screened for sexually transmitted infections (STIs) including gonorrhea and chlamydia if:  You are sexually active and are younger than 43 years of age.  You are older than 43 years of age and your health care provider tells you that you are at risk for this type of infection.  Your sexual activity has changed since you were last screened and you are at an increased risk for chlamydia or gonorrhea. Ask your health care provider if you are at risk.  If you do not have HIV, but are at risk, it may be recommended that you take a prescription medicine daily to prevent HIV infection. This is called pre-exposure prophylaxis (PrEP). You are considered at risk if:  You are sexually active and do not regularly use condoms or know the HIV status of your partner(s).  You take drugs by  injection.  You are sexually active with a partner who has HIV. Talk with your health care provider about whether you are at high risk of being infected with HIV. If you choose to begin PrEP, you should first be tested for HIV. You should then be tested every 3 months for as long as you are taking PrEP.  PREGNANCY   If you are premenopausal and you may become pregnant, ask your health care provider about  preconception counseling.  If you may become pregnant, take 400 to 800 micrograms (mcg) of folic acid every day.  If you want to prevent pregnancy, talk to your health care provider about birth control (contraception). OSTEOPOROSIS AND MENOPAUSE   Osteoporosis is a disease in which the bones lose minerals and strength with aging. This can result in serious bone fractures. Your risk for osteoporosis can be identified using a bone density scan.  If you are 70 years of age or older, or if you are at risk for osteoporosis and fractures, ask your health care provider if you should be screened.  Ask your health care provider whether you should take a calcium or vitamin D supplement to lower your risk for osteoporosis.  Menopause may have certain physical symptoms and risks.  Hormone replacement therapy may reduce some of these symptoms and risks. Talk to your health care provider about whether hormone replacement therapy is right for you.  HOME CARE INSTRUCTIONS   Schedule regular health, dental, and eye exams.  Stay current with your immunizations.   Do not use any tobacco products including cigarettes, chewing tobacco, or electronic cigarettes.  If you are pregnant, do not drink alcohol.  If you are breastfeeding, limit how much and how often you drink alcohol.  Limit alcohol intake to no more than 1 drink per day for nonpregnant women. One drink equals 12 ounces of beer, 5 ounces of wine, or 1 ounces of hard liquor.  Do not use street drugs.  Do not share needles.  Ask your  health care provider for help if you need support or information about quitting drugs.  Tell your health care provider if you often feel depressed.  Tell your health care provider if you have ever been abused or do not feel safe at home.   This information is not intended to replace advice given to you by your health care provider. Make sure you discuss any questions you have with your health care provider.   Document Released: 12/22/2010 Document Revised: 06/29/2014 Document Reviewed: 05/10/2013 Elsevier Interactive Patient Education [employer].

## 2015-12-19 NOTE — Progress Notes (Signed)
[name] [name] 10/11/1970 [id number]    History:    Presents for annual exam.  Rare spotting Mirena IUD placed 05/2012. Normal Pap history has not had a mammogram.  Past medical history, past surgical history, family history and social history were all reviewed and documented in the EPIC chart. [name], [name] both doing well. Paternal and maternal grandmother breast cancer. Works in [employer] for [employer]. Parents healthy.  ROS:  A ROS was performed and pertinent positives and negatives are included.  Exam:  Filed Vitals:   12/19/15 1550  BP: 124/80    General appearance:  Normal Thyroid:  Symmetrical, normal in size, without palpable masses or nodularity. Respiratory  Auscultation:  Clear without wheezing or rhonchi Cardiovascular  Auscultation:  Regular rate, without rubs, murmurs or gallops  Edema/varicosities:  Not grossly evident Abdominal  Soft,nontender, without masses, guarding or rebound.  Liver/spleen:  No organomegaly noted  Hernia:  None appreciated  Skin  Inspection:  Grossly normal   Breasts: Examined lying and sitting.     Right: Without masses, retractions, discharge or axillary adenopathy.     Left: Without masses, retractions, discharge or axillary adenopathy. Gentitourinary   Inguinal/mons:  Normal without inguinal adenopathy  External genitalia:  Normal  BUS/Urethra/Skene's glands:  Normal  Vagina: +1 cystocele asymptomatic  Cervix:  Normal  Uterus:   normal in size, shape and contour.  Midline and mobile  Adnexa/parametria:     Rt: Without masses or tenderness.   Lt: Without masses or tenderness.  Anus and perineum: Normal  Digital rectal exam: Normal sphincter tone without palpated masses or tenderness  Assessment/Plan:  45 y.o. [name] for annual exam with no complaints.  05/2012 Mirena IUD occasional spotting  Plan: SBE's, reviewed importance of annual screening mammogram breast center information given instructed to  schedule. Happy with Mirena IUD plans to have removed and replaced 2018 aware it is good for 5 years. Continue regular exercise, calcium rich diet, vitamin D 1000 daily encouraged. CBC, CMP, lipid panel, UA, Pap with HR HPV typing, new screening guidelines reviewed.  Anna ChallengerYOUNG,[name] [facility] HospitalWHNP, 4:38 PM 12/19/2015

## 2015-12-20 LAB — COMPREHENSIVE METABOLIC PANEL
ALBUMIN: 4.3 g/dL (ref 3.6–5.1)
ALK PHOS: 35 U/L (ref 33–115)
ALT: 15 U/L (ref 6–29)
AST: 17 U/L (ref 10–30)
BILIRUBIN TOTAL: 0.8 mg/dL (ref 0.2–1.2)
BUN: 11 mg/dL (ref 7–25)
CALCIUM: 9.1 mg/dL (ref 8.6–10.2)
CO2: 22 mmol/L (ref 20–31)
CREATININE: 0.66 mg/dL (ref 0.50–1.10)
Chloride: 104 mmol/L (ref 98–110)
Glucose, Bld: 88 mg/dL (ref 65–99)
Potassium: 4.1 mmol/L (ref 3.5–5.3)
SODIUM: 138 mmol/L (ref 135–146)
TOTAL PROTEIN: 6.5 g/dL (ref 6.1–8.1)

## 2015-12-20 LAB — CBC WITH DIFFERENTIAL/PLATELET
BASOS PCT: 0 %
Basophils Absolute: 0 cells/uL (ref 0–200)
EOS PCT: 1 %
Eosinophils Absolute: 57 cells/uL (ref 15–500)
HEMATOCRIT: 41.2 % (ref 35.0–45.0)
HEMOGLOBIN: 13.6 g/dL (ref 11.7–15.5)
LYMPHS ABS: 1368 {cells}/uL (ref 850–3900)
Lymphocytes Relative: 24 %
MCH: 32.8 pg (ref 27.0–33.0)
MCHC: 33 g/dL (ref 32.0–36.0)
MCV: 99.3 fL (ref 80.0–100.0)
MONO ABS: 342 {cells}/uL (ref 200–950)
MPV: 11.5 fL (ref 7.5–12.5)
Monocytes Relative: 6 %
NEUTROS ABS: 3933 {cells}/uL (ref 1500–7800)
NEUTROS PCT: 69 %
Platelets: 212 10*3/uL (ref 140–400)
RBC: 4.15 MIL/uL (ref 3.80–5.10)
RDW: 13.7 % (ref 11.0–15.0)
WBC: 5.7 10*3/uL (ref 3.8–10.8)

## 2015-12-20 LAB — LIPID PANEL
CHOL/HDL RATIO: 3.7 ratio (ref <=5.0)
CHOLESTEROL: 187 mg/dL (ref 125–200)
HDL: 51 mg/dL (ref >=46)
LDL Cholesterol: 115 mg/dL (ref <130)
Triglycerides: 104 mg/dL (ref <150)
VLDL: 21 mg/dL (ref <30)

## 2015-12-20 LAB — URINALYSIS W MICROSCOPIC + REFLEX CULTURE
BILIRUBIN URINE: NEGATIVE
Bacteria, UA: NONE SEEN [HPF]
CRYSTALS: NONE SEEN [HPF]
Casts: NONE SEEN [LPF]
GLUCOSE, UA: NEGATIVE
Hgb urine dipstick: NEGATIVE
Ketones, ur: NEGATIVE
Nitrite: NEGATIVE
PROTEIN: NEGATIVE
RBC / HPF: NONE SEEN RBC/HPF (ref <=2)
SPECIFIC GRAVITY, URINE: 1.011 (ref 1.001–1.035)
Squamous Epithelial / LPF: NONE SEEN [HPF] (ref <=5)
YEAST: NONE SEEN [HPF]
pH: 7.5 (ref 5.0–8.0)

## 2015-12-21 LAB — URINE CULTURE
COLONY COUNT: NO GROWTH
ORGANISM ID, BACTERIA: NO GROWTH

## 2015-12-23 LAB — PAP, TP IMAGING W/ HPV RNA, RFLX HPV TYPE 16,18/45: HPV mRNA, High Risk: NOT DETECTED

## 2019-03-14 ENCOUNTER — Encounter: Payer: Self-pay | Admitting: Gynecology

## 2019-09-27 ENCOUNTER — Ambulatory Visit: Payer: BLUE CROSS/BLUE SHIELD | Admitting: Women's Health

## 2019-09-27 ENCOUNTER — Other Ambulatory Visit: Payer: Self-pay

## 2019-09-27 ENCOUNTER — Ambulatory Visit (INDEPENDENT_AMBULATORY_CARE_PROVIDER_SITE_OTHER): Payer: Managed Care, Other (non HMO) | Admitting: Nurse Practitioner

## 2019-09-27 ENCOUNTER — Encounter: Payer: Self-pay | Admitting: Nurse Practitioner

## 2019-09-27 VITALS — BP 110/80 | Ht 65.0 in | Wt 142.0 lb

## 2019-09-27 DIAGNOSIS — Z13220 Encounter for screening for lipoid disorders: Secondary | ICD-10-CM

## 2019-09-27 DIAGNOSIS — N912 Amenorrhea, unspecified: Secondary | ICD-10-CM | POA: Diagnosis not present

## 2019-09-27 DIAGNOSIS — Z23 Encounter for immunization: Secondary | ICD-10-CM

## 2019-09-27 DIAGNOSIS — Z01419 Encounter for gynecological examination (general) (routine) without abnormal findings: Secondary | ICD-10-CM

## 2019-09-27 DIAGNOSIS — E785 Hyperlipidemia, unspecified: Secondary | ICD-10-CM | POA: Diagnosis not present

## 2019-09-27 NOTE — Patient Instructions (Addendum)
Multivitamin daily   Breast center [phone] Health Maintenance, Female Adopting a healthy lifestyle and getting preventive care are important in promoting health and wellness. Ask your health care provider about:  The right schedule for you to have regular tests and exams.  Things you can do on your own to prevent diseases and keep yourself healthy. What should I know about diet, weight, and exercise? Eat a healthy diet   Eat a diet that includes plenty of vegetables, fruits, low-fat dairy products, and lean protein.  Do not eat a lot of foods that are high in solid fats, added sugars, or sodium. Maintain a healthy weight Body mass index (BMI) is used to identify weight problems. It estimates body fat based on height and weight. Your health care provider can help determine your BMI and help you achieve or maintain a healthy weight. Get regular exercise Get regular exercise. This is one of the most important things you can do for your health. Most adults should:  Exercise for at least 150 minutes each week. The exercise should increase your heart rate and make you sweat (moderate-intensity exercise).  Do strengthening exercises at least twice a week. This is in addition to the moderate-intensity exercise.  Spend less time sitting. Even light physical activity can be beneficial. Watch cholesterol and blood lipids Have your blood tested for lipids and cholesterol at 47 years of age, then have this test every 5 years. Have your cholesterol levels checked more often if:  Your lipid or cholesterol levels are high.  You are older than 47 years of age.  You are at high risk for heart disease. What should I know about cancer screening? Depending on your health history and family history, you may need to have cancer screening at various ages. This may include screening for:  Breast cancer.  Cervical cancer.  Colorectal cancer.  Skin cancer.  Lung cancer. What should I know about  heart disease, diabetes, and high blood pressure? Blood pressure and heart disease  High blood pressure causes heart disease and increases the risk of stroke. This is more likely to develop in people who have high blood pressure readings, are of African descent, or are overweight.  Have your blood pressure checked: ? Every 3-5 years if you are 67-45 years of age. ? Every year if you are 27 years old or older. Diabetes Have regular diabetes screenings. This checks your fasting blood sugar level. Have the screening done:  Once every three years after age 67 if you are at a normal weight and have a low risk for diabetes.  More often and at a younger age if you are overweight or have a high risk for diabetes. What should I know about preventing infection? Hepatitis B If you have a higher risk for hepatitis B, you should be screened for this virus. Talk with your health care provider to find out if you are at risk for hepatitis B infection. Hepatitis C Testing is recommended for:  Everyone born from [birth year] through 1963.  Anyone with known risk factors for hepatitis C. Sexually transmitted infections (STIs)  Get screened for STIs, including gonorrhea and chlamydia, if: ? You are sexually active and are younger than 47 years of age. ? You are older than 47 years of age and your health care provider tells you that you are at risk for this type of infection. ? Your sexual activity has changed since you were last screened, and you are at increased risk for chlamydia or  gonorrhea. Ask your health care provider if you are at risk.  Ask your health care provider about whether you are at high risk for HIV. Your health care provider may recommend a prescription medicine to help prevent HIV infection. If you choose to take medicine to prevent HIV, you should first get tested for HIV. You should then be tested every 3 months for as long as you are taking the medicine. Pregnancy  If you are about to  stop having your period (premenopausal) and you may become pregnant, seek counseling before you get pregnant.  Take 400 to 800 micrograms (mcg) of folic acid every day if you become pregnant.  Ask for birth control (contraception) if you want to prevent pregnancy. Osteoporosis and menopause Osteoporosis is a disease in which the bones lose minerals and strength with aging. This can result in bone fractures. If you are 65 years old or older, or if you are at risk for osteoporosis and fractures, ask your health care provider if you should:  Be screened for bone loss.  Take a calcium or vitamin D supplement to lower your risk of fractures.  Be given hormone replacement therapy (HRT) to treat symptoms of menopause. Follow these instructions at home: Lifestyle  Do not use any products that contain nicotine or tobacco, such as cigarettes, e-cigarettes, and chewing tobacco. If you need help quitting, ask your health care provider.  Do not use street drugs.  Do not share needles.  Ask your health care provider for help if you need support or information about quitting drugs. Alcohol use  Do not drink alcohol if: ? Your health care provider tells you not to drink. ? You are pregnant, may be pregnant, or are planning to become pregnant.  If you drink alcohol: ? Limit how much you use to 0-1 drink a day. ? Limit intake if you are breastfeeding.  Be aware of how much alcohol is in your drink. In the [place] one drink equals one 12 oz bottle of beer (355 mL), one 5 oz glass of wine (148 mL), or one 1 oz glass of hard liquor (44 mL). General instructions  Schedule regular health, dental, and eye exams.  Stay current with your vaccines.  Tell your health care provider if: ? You often feel depressed. ? You have ever been abused or do not feel safe at home. Summary  Adopting a healthy lifestyle and getting preventive care are important in promoting health and wellness.  Follow your  health care provider's instructions about healthy diet, exercising, and getting tested or screened for diseases.  Follow your health care provider's instructions on monitoring your cholesterol and blood pressure. This information is not intended to replace advice given to you by your health care provider. Make sure you discuss any questions you have with your health care provider. Document Revised: 06/01/2018 Document Reviewed: 06/01/2018 Elsevier Patient Education  2020 Else[employer]  

## 2019-09-27 NOTE — Progress Notes (Signed)
[name] 11/10/1970 [id number]    History:    49 year old MWF G4P2 presents for annual exam and IUD removal. IUD placed in 2013, was instructed to return for removal in 2018 but did not remember to do so. Denies any urinary symptoms, pain, vaginal itching, discharge or odor. Reports no menses for 2 years. Does not have a PCP but plans to get one.    Past medical history, past surgical history, family history and social history were all reviewed and documented in the EPIC chart. Breast cancer-paternal grandmother and paternal Aunt. Works in [employer]. Two children- one at [facility] [employer]  ROS:  A ROS was performed and pertinent positives and negatives are included.  Exam:  Vitals:   09/27/19 1016  BP: 110/80  Weight: 142 lb (64.4 kg)  Height: 5\' 5"  (1.651 m)   Body mass index is 23.63 kg/m.   General appearance:  Normal Thyroid:  Symmetrical, normal in size, without palpable masses or nodularity. Respiratory  Auscultation:  Clear without wheezing or rhonchi Cardiovascular  Auscultation:  Regular rate, without rubs, murmurs or gallops  Edema/varicosities:  Not grossly evident Abdominal  Soft,nontender, without masses, guarding or rebound.  Liver/spleen:  No organomegaly noted  Hernia:  None appreciated  Skin  Inspection:  Grossly normal   Breasts: Examined lying and sitting.     Right: Without masses, retractions, discharge or axillary adenopathy.     Left: Without masses, retractions, discharge or axillary adenopathy. Gentitourinary   Inguinal/mons:  Normal without inguinal adenopathy  External genitalia:  Normal  BUS/Urethra/Skene's glands:  Normal  Vagina:  Normal  Cervix:  Normal, IUD string visible  Uterus:  Anteverted, normal in size, shape and contour.  Midline and mobile  Adnexa/parametria:     Rt: Without masses or tenderness.   Lt: Without masses or tenderness.  Anus and perineum: Normal   Assessment/Plan:  49 y.o. MWF for annual exam and  IUD removal.  Amenorrhea with some menopausal symptoms Contraceptive Management-IUD removal Immunization  Plan: Encouraged SBEs, diet, exercise, multivitamin daily. Patient will schedule mammogram. Breast center information given, reviewed importance of getting screened Tdap today. Labs pending-CBC, CMP, lipid panel, FSH. Removed IUD with ease, intact, shown to patient and discarded. Will schedule IUD insertion. Follow up in 1 year for annual.    [facility], 10:26 AM 09/27/2019

## 2019-09-28 LAB — COMPREHENSIVE METABOLIC PANEL
AG Ratio: 1.8 (calc) (ref 1.0–2.5)
ALT: 20 U/L (ref 6–29)
AST: 25 U/L (ref 10–35)
Albumin: 4.5 g/dL (ref 3.6–5.1)
Alkaline phosphatase (APISO): 39 U/L (ref 31–125)
BUN: 15 mg/dL (ref 7–25)
CO2: 24 mmol/L (ref 20–32)
Calcium: 9.5 mg/dL (ref 8.6–10.2)
Chloride: 105 mmol/L (ref 98–110)
Creat: 0.75 mg/dL (ref 0.50–1.10)
Globulin: 2.5 g/dL (calc) (ref 1.9–3.7)
Glucose, Bld: 82 mg/dL (ref 65–99)
Potassium: 5 mmol/L (ref 3.5–5.3)
Sodium: 141 mmol/L (ref 135–146)
Total Bilirubin: 1.1 mg/dL (ref 0.2–1.2)
Total Protein: 7 g/dL (ref 6.1–8.1)

## 2019-09-28 LAB — CBC WITH DIFFERENTIAL/PLATELET
Absolute Monocytes: 338 cells/uL (ref 200–950)
Basophils Absolute: 20 cells/uL (ref 0–200)
Basophils Relative: 0.4 %
Eosinophils Absolute: 39 cells/uL (ref 15–500)
Eosinophils Relative: 0.8 %
HCT: 41.7 % (ref 35.0–45.0)
Hemoglobin: 14.5 g/dL (ref 11.7–15.5)
Lymphs Abs: 1005 cells/uL (ref 850–3900)
MCH: 32.8 pg (ref 27.0–33.0)
MCHC: 34.8 g/dL (ref 32.0–36.0)
MCV: 94.3 fL (ref 80.0–100.0)
MPV: 12 fL (ref 7.5–12.5)
Monocytes Relative: 6.9 %
Neutro Abs: 3499 cells/uL (ref 1500–7800)
Neutrophils Relative %: 71.4 %
Platelets: 238 10*3/uL (ref 140–400)
RBC: 4.42 10*6/uL (ref 3.80–5.10)
RDW: 12.6 % (ref 11.0–15.0)
Total Lymphocyte: 20.5 %
WBC: 4.9 10*3/uL (ref 3.8–10.8)

## 2019-09-28 LAB — FOLLICLE STIMULATING HORMONE: FSH: 110.1 m[IU]/mL

## 2019-09-28 LAB — LIPID PANEL
Cholesterol: 241 mg/dL — ABNORMAL HIGH (ref <200)
HDL: 49 mg/dL — ABNORMAL LOW (ref >=50)
LDL Cholesterol (Calc): 172 mg/dL (calc) — ABNORMAL HIGH
Non-HDL Cholesterol (Calc): 192 mg/dL (calc) — ABNORMAL HIGH (ref <130)
Total CHOL/HDL Ratio: 4.9 (calc) (ref <5.0)
Triglycerides: 87 mg/dL (ref <150)

## 2019-09-28 MED ORDER — ROSUVASTATIN CALCIUM 10 MG PO TABS: 10.0000 mg | ORAL_TABLET | Freq: Every day | ORAL | 6 refills | Status: DC

## 2019-09-28 NOTE — Addendum Note (Signed)
Addended byWyline Beady on: 09/28/2019 08:41 AM   Modules accepted: Orders

## 2019-09-29 LAB — PAP, TP IMAGING W/ HPV RNA, RFLX HPV TYPE 16,18/45: HPV DNA High Risk: NOT DETECTED

## 2019-10-02 ENCOUNTER — Other Ambulatory Visit: Payer: Self-pay | Admitting: Nurse Practitioner

## 2019-10-02 DIAGNOSIS — Z1231 Encounter for screening mammogram for malignant neoplasm of breast: Secondary | ICD-10-CM

## 2019-10-03 ENCOUNTER — Other Ambulatory Visit: Payer: Self-pay

## 2019-10-04 ENCOUNTER — Ambulatory Visit (INDEPENDENT_AMBULATORY_CARE_PROVIDER_SITE_OTHER): Payer: Managed Care, Other (non HMO) | Admitting: Obstetrics and Gynecology

## 2019-10-04 ENCOUNTER — Encounter: Payer: Self-pay | Admitting: Obstetrics and Gynecology

## 2019-10-04 VITALS — BP 122/76

## 2019-10-04 DIAGNOSIS — Z30430 Encounter for insertion of intrauterine contraceptive device: Secondary | ICD-10-CM | POA: Diagnosis not present

## 2019-10-04 NOTE — Progress Notes (Signed)
   [name]  1970/08/03 [id number]  HPI The patient is a 49 y.o. [id number] who presents today for Mirena IUD placement.  Her IUD was due for removal in 2018, so it was overdue for removal when she had it removed at her visit 1 week ago.  She had been amenorrheic for 2 years.  Recent FSH level was 110 well into the menopausal range.  She would like another Mirena IUD for contraception as we are not certain of her menopausal status.  She has not been sexually active since the IUD was removed last week.  I counseled her on the Mirena IUD including in the initial period of time after insertion to expect cramping and/or abnormal bleeding, especially in the first 3-6 months use.  After this time period the period can get very light or some women become amenorrheic.  Insertion risks include infection, uterine perforation and device migration into the abdomen which would require surgical removal, or the device can become dislodged causing pain or fall out of which would preclude effective contraception.  Physical Exam BP 122/76   Procedure note Mirena IUD placement The cervix was cleansed with a Hibiclens swab x1.  Endometrial pipelle was used to obtain a sound length of 6 cm.  Maintaining sterility, the flange on the IUD insertion tube was set to the appropriate length and the insertion tube was inserted into the cervix and endometrial cavity without difficulty until gentle fundal resistance was met.  The arms of the IUD were deployed and the Mirena was inserted without difficulty. The strings were trimmed to a length of 2 cm.  The patient tolerated the procedure well without any complication.  Caryn Bee present for procedure    Joseph Pierini MD, Cherlynn June 10/04/19

## 2019-10-05 ENCOUNTER — Encounter: Payer: Self-pay | Admitting: Gynecology

## 2019-10-09 ENCOUNTER — Ambulatory Visit
Admission: RE | Admit: 2019-10-09 | Discharge: 2019-10-09 | Disposition: A | Discharge status: Home / Self Care | Payer: Managed Care, Other (non HMO) | Source: Ambulatory Visit | Attending: Nurse Practitioner | Admitting: Nurse Practitioner

## 2019-10-09 ENCOUNTER — Other Ambulatory Visit: Payer: Self-pay

## 2019-10-09 DIAGNOSIS — Z1231 Encounter for screening mammogram for malignant neoplasm of breast: Secondary | ICD-10-CM

## 2019-11-08 ENCOUNTER — Encounter: Payer: Self-pay | Admitting: Obstetrics and Gynecology

## 2019-11-08 ENCOUNTER — Other Ambulatory Visit: Payer: Self-pay

## 2019-11-08 ENCOUNTER — Ambulatory Visit (INDEPENDENT_AMBULATORY_CARE_PROVIDER_SITE_OTHER): Payer: Managed Care, Other (non HMO) | Admitting: Obstetrics and Gynecology

## 2019-11-08 VITALS — BP 118/76

## 2019-11-08 DIAGNOSIS — Z30431 Encounter for routine checking of intrauterine contraceptive device: Secondary | ICD-10-CM

## 2019-11-08 NOTE — Progress Notes (Signed)
   [name] July 18, 1970 [id number]  SUBJECTIVE:  49 y.o. [id number] female presents for Mirena IUD check, which she had placed 10/04/2019.  She is not having any problems.  Current Outpatient Medications  Medication Sig Dispense Refill  . levonorgestrel (MIRENA) 20 MCG/24HR IUD 1 each by Intrauterine route once.    . rosuvastatin (CRESTOR) 10 MG tablet Take 1 tablet (10 mg total) by mouth daily. 30 tablet 6   No current facility-administered medications for this visit.   Allergies: Iodine  No LMP recorded. (Menstrual status: IUD).  Past medical history,surgical history, problem list, medications, allergies, family history and social history were all reviewed and documented as reviewed in the EPIC chart.  OBJECTIVE:  BP 118/76  The patient appears well, alert, oriented x 3, in no distress. PELVIC EXAM: VULVA: normal appearing vulva with no masses, tenderness or lesions, VAGINA: normal appearing vagina with normal color and discharge, no lesions, CERVIX: normal appearing cervix without abnormal discharge or lesions, thick mucus discharge is wiped away, the IUD strings were not seen initially, gentle probing of the distal cervical canal indicates the strings did tuck up into the distal cervical canal and were visible after this maneuver.  Chaperone: Kennon Portela present during the examination  ASSESSMENT:  50 y.o. [phone] here for IUD string check  PLAN:  Patient is reassured that the IUD appears to be in a normal position at this time.  She will let us know if she is having any issues or concerns.   Theresia Majors MD 11/08/19

## 2021-01-31 ENCOUNTER — Other Ambulatory Visit: Payer: Self-pay | Admitting: Nurse Practitioner

## 2021-01-31 DIAGNOSIS — Z1231 Encounter for screening mammogram for malignant neoplasm of breast: Secondary | ICD-10-CM

## 2021-02-05 ENCOUNTER — Ambulatory Visit
Admission: RE | Admit: 2021-02-05 | Discharge: 2021-02-05 | Disposition: A | Discharge status: Home / Self Care | Payer: 59 | Source: Ambulatory Visit | Attending: Nurse Practitioner | Admitting: Nurse Practitioner

## 2021-02-05 ENCOUNTER — Other Ambulatory Visit: Payer: Self-pay

## 2021-02-05 DIAGNOSIS — Z1231 Encounter for screening mammogram for malignant neoplasm of breast: Secondary | ICD-10-CM

## 2021-03-11 ENCOUNTER — Encounter: Payer: Self-pay | Admitting: Nurse Practitioner

## 2021-03-11 ENCOUNTER — Ambulatory Visit (INDEPENDENT_AMBULATORY_CARE_PROVIDER_SITE_OTHER): Payer: 59 | Admitting: Nurse Practitioner

## 2021-03-11 ENCOUNTER — Other Ambulatory Visit: Payer: Self-pay

## 2021-03-11 VITALS — BP 116/74 | Ht 64.0 in | Wt 142.0 lb

## 2021-03-11 DIAGNOSIS — Z30431 Encounter for routine checking of intrauterine contraceptive device: Secondary | ICD-10-CM | POA: Diagnosis not present

## 2021-03-11 DIAGNOSIS — Z01419 Encounter for gynecological examination (general) (routine) without abnormal findings: Secondary | ICD-10-CM | POA: Diagnosis not present

## 2021-03-11 DIAGNOSIS — E785 Hyperlipidemia, unspecified: Secondary | ICD-10-CM

## 2021-03-11 NOTE — Progress Notes (Signed)
   [name] 1970/12/03 [id number]   History:  50 y.o. [id number] presents for annual exam. Amenorrheic/Mirena IUD 09/2019. Normal pap and mammogram history.   Gynecologic History No LMP recorded. (Menstrual status: IUD).   Contraception/Family planning: IUD Sexually active: Yes  Health Maintenance Last Pap: 09/27/2019. Results were: Normal, 5-year repeat Last mammogram: 02/05/2021. Results were: Normal Last colonoscopy: Never Last Dexa: Not indicated  Past medical history, past surgical history, family history and social history were all reviewed and documented in the EPIC chart. Married.  2 children. PGM and paternal aunt with history of breast cancer.   ROS:  A ROS was performed and pertinent positives and negatives are included.  Exam:  Vitals:   03/11/21 0903  BP: 116/74  Weight: 142 lb (64.4 kg)  Height: 5\' 4"  (1.626 m)   Body mass index is 24.37 kg/m.  General appearance:  Normal Thyroid:  Symmetrical, normal in size, without palpable masses or nodularity. Respiratory  Auscultation:  Clear without wheezing or rhonchi Cardiovascular  Auscultation:  Regular rate, without rubs, murmurs or gallops  Edema/varicosities:  Not grossly evident Abdominal  Soft,nontender, without masses, guarding or rebound.  Liver/spleen:  No organomegaly noted  Hernia:  None appreciated  Skin  Inspection:  Grossly normal Breasts: Examined lying and sitting.   Right: Without masses, retractions, nipple discharge or axillary adenopathy.   Left: Without masses, retractions, nipple discharge or axillary adenopathy. Genitourinary   Inguinal/mons:  Normal without inguinal adenopathy  External genitalia:  Normal appearing vulva with no masses, tenderness, or lesions  BUS/Urethra/Skene's glands:  Normal  Vagina:  Normal appearing with normal color and discharge, no lesions  Cervix:  Normal appearing without discharge or lesions IUD string not visualized at exocervix  Uterus:  Normal in  size, shape and contour.  Midline and mobile, nontender  Adnexa/parametria:     Rt: Normal in size, without masses or tenderness.   Lt: Normal in size, without masses or tenderness.  Anus and perineum: Normal  Digital rectal exam: Normal sphincter tone without palpated masses or tenderness  Patient informed chaperone available to be present for breast and pelvic exam. Patient has requested no chaperone to be present. Patient has been advised what will be completed during breast and pelvic exam.   Assessment/Plan:  50 y.o. 54 for annual exam.   Well female exam with routine gynecological exam - Plan: CBC with Differential/Platelet, Comprehensive metabolic panel, VITAMIN D 25 Hydroxy (Vit-D Deficiency, Fractures). Education provided on SBEs, importance of preventative screenings, current guidelines, high calcium diet, regular exercise, and multivitamin daily.   Encounter for routine checking of intrauterine contraceptive device (IUD) - Amenorrheic. Mirena IUD inserted 09/2019.   Hyperlipidemia, unspecified hyperlipidemia type - Plan: Lipid panel. Last year it was recommended she see PCP for management but she was unable to find one. Information provided on [facility].   Screening for cervical cancer - Normal Pap history.  Will repeat at 5-year interval per guidelines.  Screening for breast cancer - Normal mammogram history.  Continue annual screenings.  Normal breast exam today.  Screening for colon cancer - Has not had screening colonoscopy. Discussed current guidelines and importance of preventative screenings.  Information provided on [facility].   Return in 1 year for annual.   10/2019 DNP, 9:56 AM 03/11/2021

## 2021-03-11 NOTE — Patient Instructions (Signed)
[facility] [phone] [address] [place],  [address]  

## 2021-03-12 LAB — COMPREHENSIVE METABOLIC PANEL
AG Ratio: 1.9 (calc) (ref 1.0–2.5)
ALT: 15 U/L (ref 6–29)
AST: 20 U/L (ref 10–35)
Albumin: 4.4 g/dL (ref 3.6–5.1)
Alkaline phosphatase (APISO): 42 U/L (ref 31–125)
BUN: 10 mg/dL (ref 7–25)
CO2: 27 mmol/L (ref 20–32)
Calcium: 9.5 mg/dL (ref 8.6–10.2)
Chloride: 102 mmol/L (ref 98–110)
Creat: 0.76 mg/dL (ref 0.50–0.99)
Globulin: 2.3 g/dL (calc) (ref 1.9–3.7)
Glucose, Bld: 78 mg/dL (ref 65–99)
Potassium: 4.4 mmol/L (ref 3.5–5.3)
Sodium: 139 mmol/L (ref 135–146)
Total Bilirubin: 0.9 mg/dL (ref 0.2–1.2)
Total Protein: 6.7 g/dL (ref 6.1–8.1)

## 2021-03-12 LAB — CBC WITH DIFFERENTIAL/PLATELET
Absolute Monocytes: 340 cells/uL (ref 200–950)
Basophils Absolute: 21 cells/uL (ref 0–200)
Basophils Relative: 0.5 %
Eosinophils Absolute: 42 cells/uL (ref 15–500)
Eosinophils Relative: 1 %
HCT: 42 % (ref 35.0–45.0)
Hemoglobin: 14.3 g/dL (ref 11.7–15.5)
Lymphs Abs: 802 cells/uL — ABNORMAL LOW (ref 850–3900)
MCH: 32.7 pg (ref 27.0–33.0)
MCHC: 34 g/dL (ref 32.0–36.0)
MCV: 96.1 fL (ref 80.0–100.0)
MPV: 11.9 fL (ref 7.5–12.5)
Monocytes Relative: 8.1 %
Neutro Abs: 2995 cells/uL (ref 1500–7800)
Neutrophils Relative %: 71.3 %
Platelets: 211 10*3/uL (ref 140–400)
RBC: 4.37 10*6/uL (ref 3.80–5.10)
RDW: 12.6 % (ref 11.0–15.0)
Total Lymphocyte: 19.1 %
WBC: 4.2 10*3/uL (ref 3.8–10.8)

## 2021-03-12 LAB — LIPID PANEL
Cholesterol: 232 mg/dL — ABNORMAL HIGH (ref <200)
HDL: 43 mg/dL — ABNORMAL LOW (ref >=50)
LDL Cholesterol (Calc): 171 mg/dL (calc) — ABNORMAL HIGH
Non-HDL Cholesterol (Calc): 189 mg/dL (calc) — ABNORMAL HIGH (ref <130)
Total CHOL/HDL Ratio: 5.4 (calc) — ABNORMAL HIGH (ref <5.0)
Triglycerides: 80 mg/dL (ref <150)

## 2021-03-12 LAB — VITAMIN D 25 HYDROXY (VIT D DEFICIENCY, FRACTURES): Vit D, 25-Hydroxy: 60 ng/mL (ref 30–100)

## 2021-10-01 IMAGING — MG DIGITAL SCREENING BILAT W/ TOMO W/ CAD
8 series · 8 of 24 positions shown · non-contrast
Comparison: None.

CLINICAL DATA: Screening.

EXAM:
DIGITAL SCREENING BILATERAL MAMMOGRAM WITH TOMO AND CAD

[L MLO synth-2D]
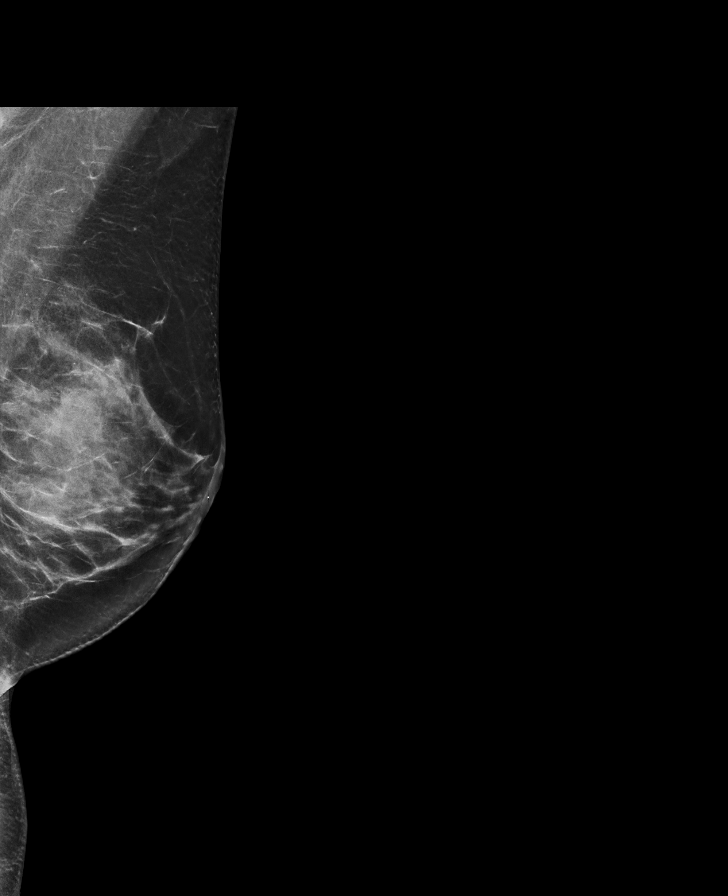

[R CC synth-2D]
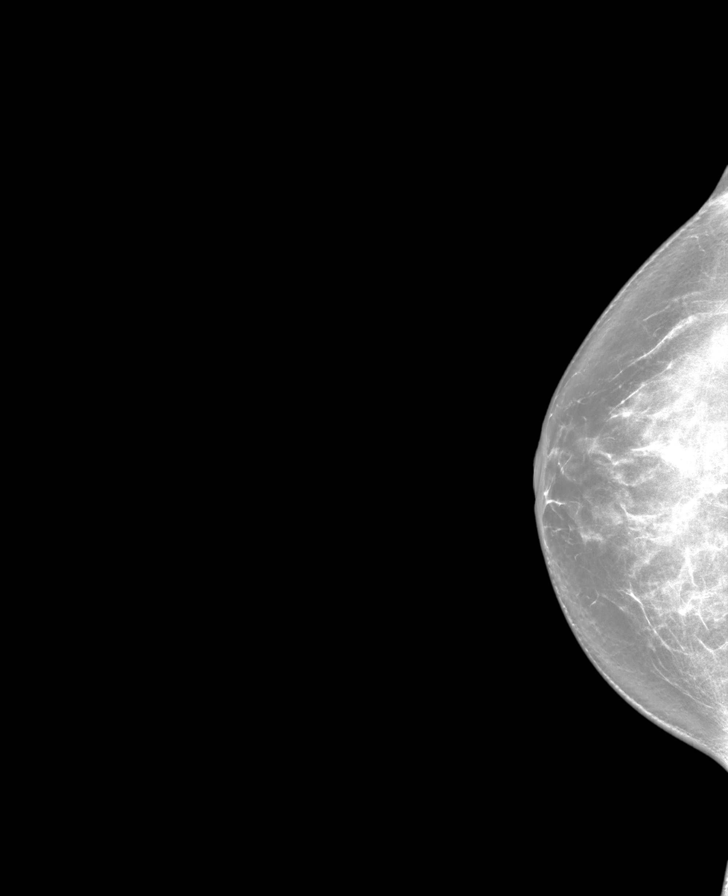

[R MLO synth-2D]
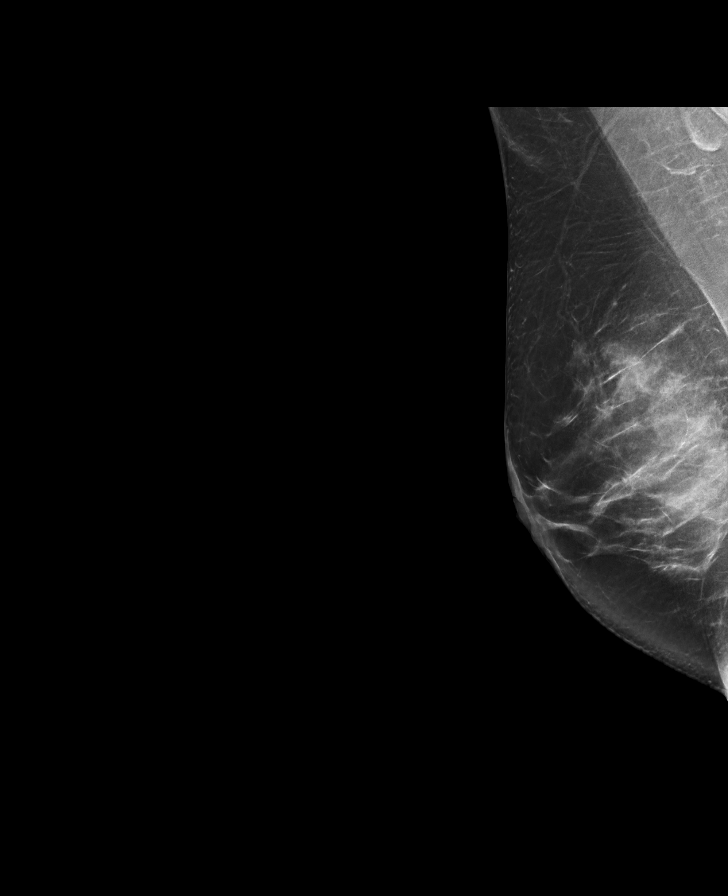

[L CC synth-2D]
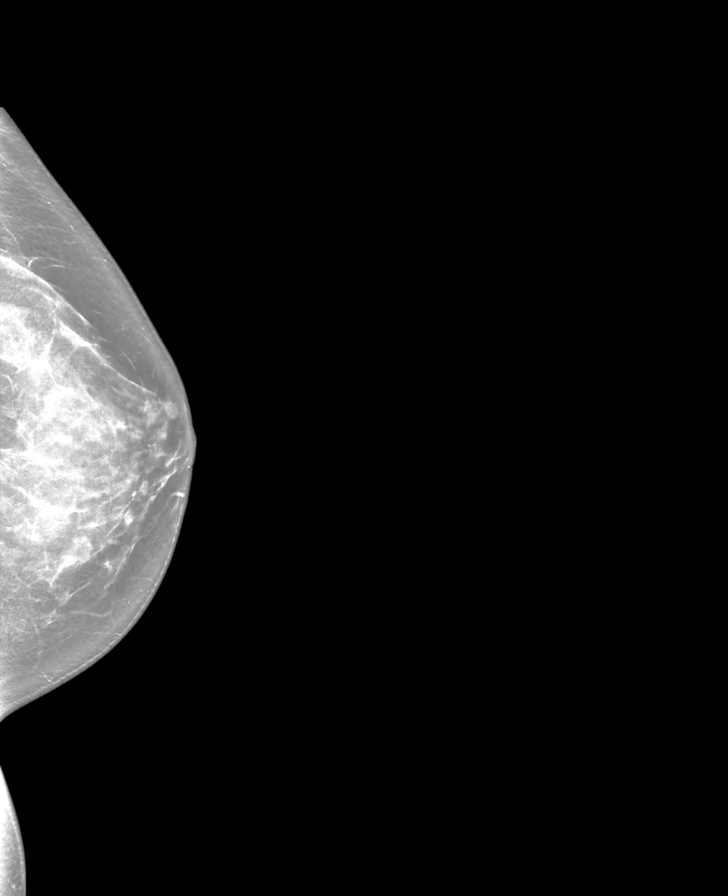

[R MLO tomo · tomo slice 43/85.0]
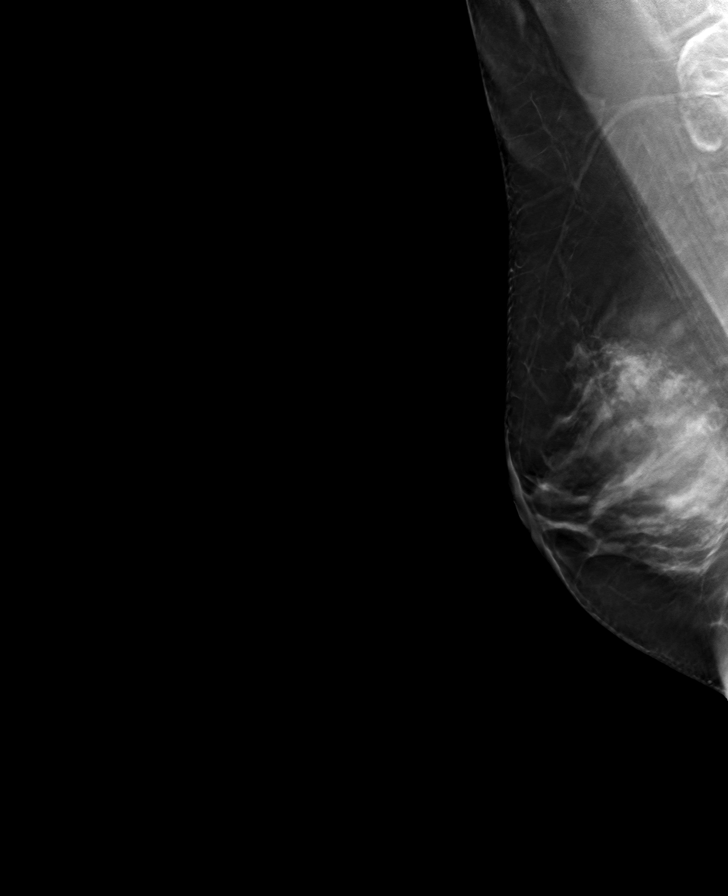

[L CC tomo · tomo slice 37/73.0]
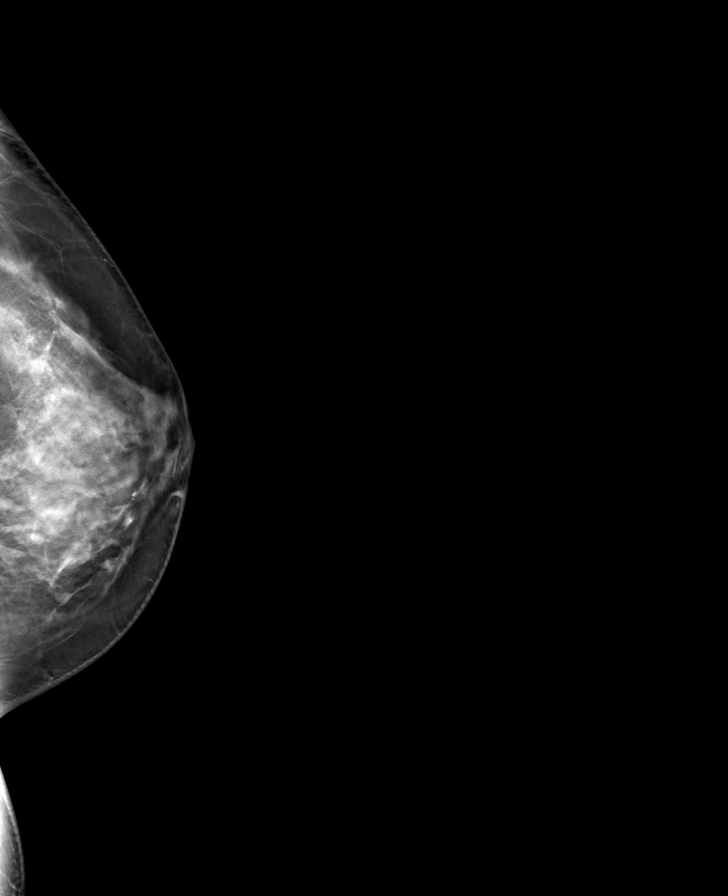

[R CC tomo · tomo slice 39/78.0]
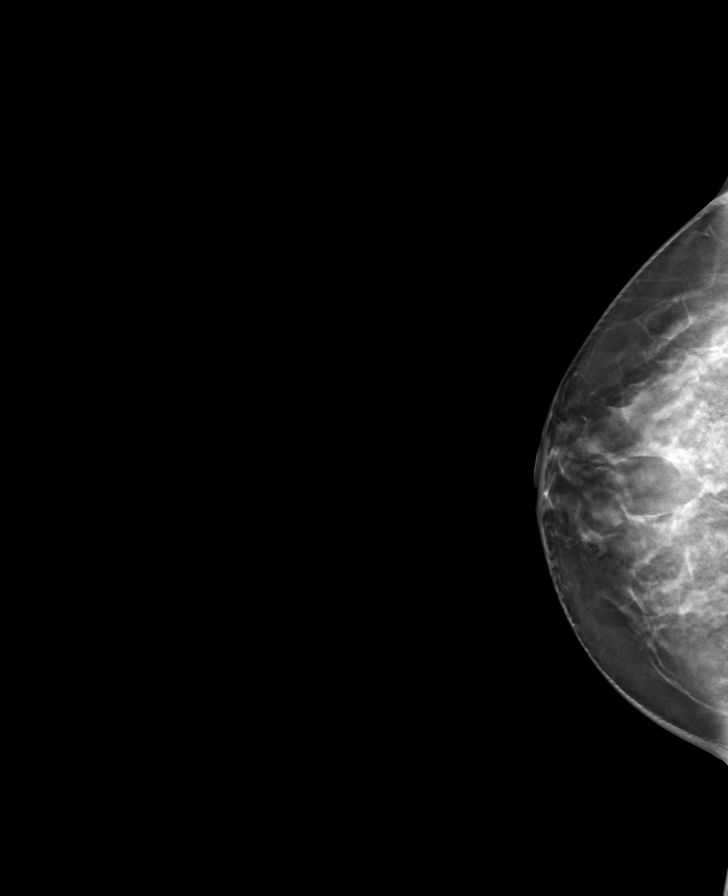

[L MLO tomo · tomo slice 39/78.0]
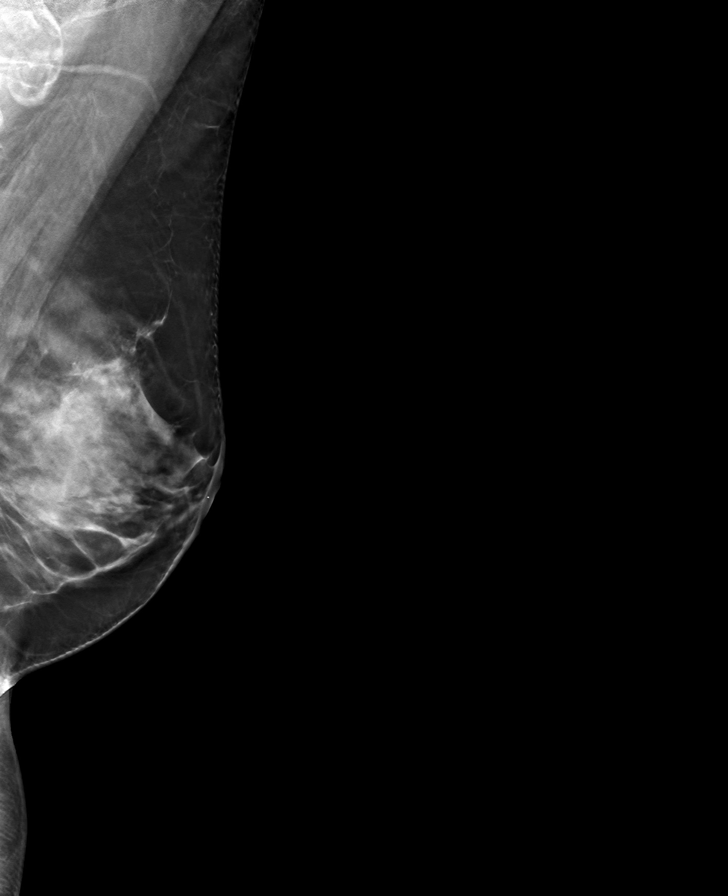

[8 of 24 positions shown; findings below may reference images not displayed]

ACR Breast Density Category c: The breast tissue is heterogeneously
dense, which may obscure small masses
FINDINGS: There are no findings suspicious for malignancy. Images were
processed with CAD.
IMPRESSION: No mammographic evidence of malignancy. A result letter of this
screening mammogram will be mailed directly to the patient.

RECOMMENDATION:
Screening mammogram in one year. (Code:EM-2-IHY)

BI-RADS CATEGORY  1: Negative.

## 2022-01-09 ENCOUNTER — Encounter: Payer: Self-pay | Admitting: Family

## 2022-01-09 ENCOUNTER — Ambulatory Visit (INDEPENDENT_AMBULATORY_CARE_PROVIDER_SITE_OTHER): Payer: Managed Care, Other (non HMO) | Admitting: Family

## 2022-01-09 VITALS — BP 120/77 | HR 57 | Temp 98.7°F | Ht 64.0 in | Wt 145.1 lb

## 2022-01-09 DIAGNOSIS — E782 Mixed hyperlipidemia: Secondary | ICD-10-CM

## 2022-01-09 NOTE — Progress Notes (Signed)
   New Patient Office Visit  Subjective:  Patient ID: [name], female    DOB: March 06, 1971  Age: 51 y.o. MRN: [id number]  CC:  Chief Complaint  Patient presents with   Establish Care    Pt would like cholesterol levels checked.    HPI [name] presents for establishing care today.  Hyperlipidemia: Patient is currently maintained on the following medication for hyperlipidemia: Crestor in past, not currently. Patient denies myalgia or other side effects when she was taking the Crestor. Patient reports good compliance with low fat/low cholesterol diet.  Last lipid panel as follows: Lab Results  Component Value Date   CHOL 232 (H) 03/11/2021   HDL 43 (L) 03/11/2021   LDLCALC 171 (H) 03/11/2021   TRIG 80 03/11/2021   CHOLHDL 5.4 (H) 03/11/2021  The 10-year ASCVD risk score (Arnett DK, et al., 2019) is: 1.7%   Values used to calculate the score:     Age: 36 years     Sex: Female     Is Non-Hispanic African American: No     Diabetic: No     Tobacco smoker: No     Systolic Blood Pressure: 120 mmHg     Is BP treated: No     HDL Cholesterol: 43 mg/dL     Total Cholesterol: 232 mg/dL  Assessment & Plan:   Problem List Items Addressed This Visit       Other   Mixed hyperlipidemia - Primary    chronic took Crestor 10mg  for 3 mos, but did not f/u for refill advised to come back for fasting labs spent large part of visit discussing low cholesterol diet, specific foods, increasing cardio exercise where able handout & video links provided f/u in 2-3 mos       Subjective:    Outpatient Medications Prior to Visit  Medication Sig Dispense Refill   levonorgestrel (MIRENA) 20 MCG/24HR IUD 1 each by Intrauterine route once.     rosuvastatin (CRESTOR) 10 MG tablet Take 1 tablet (10 mg total) by mouth daily. (Patient not taking: Reported on 01/09/2022) 30 tablet 6   No facility-administered medications prior to visit.   History reviewed. No pertinent past medical  history. Past Surgical History:  Procedure Laterality Date   INTRAUTERINE DEVICE INSERTION  05/2012   Mirena    Objective:   Today's Vitals: BP 120/77 (BP Location: Left Arm, Patient Position: Sitting, Cuff Size: Large)   Pulse (!) 57   Temp 98.7 F (37.1 C) (Temporal)   Ht 5\' 4"  (1.626 m)   Wt 145 lb 2 oz (65.8 kg)   LMP  (LMP Unknown)   SpO2 97%   BMI 24.91 kg/m   Physical Exam Vitals and nursing note reviewed.  Constitutional:      Appearance: Normal appearance.  Cardiovascular:     Rate and Rhythm: Normal rate and regular rhythm.  Pulmonary:     Effort: Pulmonary effort is normal.     Breath sounds: Normal breath sounds.  Musculoskeletal:        General: Normal range of motion.  Skin:    General: Skin is warm and dry.  Neurological:     Mental Status: She is alert.  Psychiatric:        Mood and Affect: Mood normal.        Behavior: Behavior normal.     06/2012, NP

## 2022-01-09 NOTE — Assessment & Plan Note (Addendum)
   chronic  took Crestor 10mg  for 3 mos, but did not f/u for refill  advised to come back for fasting labs  spent large part of visit discussing low cholesterol diet, specific foods, increasing cardio exercise where able  handout & video links provided  f/u in 2-3 mos

## 2022-01-09 NOTE — Patient Instructions (Signed)
It was very nice to see you today!    Continue to work on diet modifications to lower your cholesterol, see the attached handout for more tips.  Schedule a follow up visit in the next month or so, or with a physical, with fasting labs - no food or drink except black coffee or water after midnight.  Have a great weekend :-)      PLEASE NOTE:  If you had any lab tests please let us know if you have not heard back within a few days. You may see your results on MyChart before we have a chance to review them but we will give you a call once they are reviewed by [place]. If we ordered any referrals today, please let us know if you have not heard from their office within the next week.

## 2022-01-16 ENCOUNTER — Ambulatory Visit: Payer: Managed Care, Other (non HMO) | Admitting: Nurse Practitioner

## 2022-02-04 ENCOUNTER — Encounter: Payer: Self-pay | Admitting: Family

## 2022-02-04 ENCOUNTER — Ambulatory Visit: Payer: Managed Care, Other (non HMO) | Admitting: Family

## 2022-02-04 VITALS — BP 105/72 | HR 65 | Temp 98.3°F | Ht 64.0 in | Wt 148.5 lb

## 2022-02-04 DIAGNOSIS — E782 Mixed hyperlipidemia: Secondary | ICD-10-CM | POA: Diagnosis not present

## 2022-02-04 DIAGNOSIS — Z0000 Encounter for general adult medical examination without abnormal findings: Secondary | ICD-10-CM

## 2022-02-04 LAB — CBC WITH DIFFERENTIAL/PLATELET
Basophils Absolute: 0 10*3/uL (ref 0.0–0.1)
Basophils Relative: 0.3 % (ref 0.0–3.0)
Eosinophils Absolute: 0 10*3/uL (ref 0.0–0.7)
Eosinophils Relative: 0.8 % (ref 0.0–5.0)
HCT: 42.9 % (ref 36.0–46.0)
Hemoglobin: 14.5 g/dL (ref 12.0–15.0)
Lymphocytes Relative: 23.8 % (ref 12.0–46.0)
Lymphs Abs: 0.9 10*3/uL (ref 0.7–4.0)
MCHC: 33.7 g/dL (ref 30.0–36.0)
MCV: 97.4 fl (ref 78.0–100.0)
Monocytes Absolute: 0.3 10*3/uL (ref 0.1–1.0)
Monocytes Relative: 7.6 % (ref 3.0–12.0)
Neutro Abs: 2.7 10*3/uL (ref 1.4–7.7)
Neutrophils Relative %: 67.5 % (ref 43.0–77.0)
Platelets: 189 10*3/uL (ref 150.0–400.0)
RBC: 4.41 Mil/uL (ref 3.87–5.11)
RDW: 12.5 % (ref 11.5–15.5)
WBC: 3.9 10*3/uL — ABNORMAL LOW (ref 4.0–10.5)

## 2022-02-04 LAB — LIPID PANEL
Cholesterol: 247 mg/dL — ABNORMAL HIGH (ref 0–200)
HDL: 63.2 mg/dL (ref >39.00)
LDL Cholesterol: 170 mg/dL — ABNORMAL HIGH (ref 0–99)
NonHDL: 183.56
Total CHOL/HDL Ratio: 4
Triglycerides: 70 mg/dL (ref 0.0–149.0)
VLDL: 14 mg/dL (ref 0.0–40.0)

## 2022-02-04 LAB — COMPREHENSIVE METABOLIC PANEL
ALT: 26 U/L (ref 0–35)
AST: 27 U/L (ref 0–37)
Albumin: 4.6 g/dL (ref 3.5–5.2)
Alkaline Phosphatase: 46 U/L (ref 39–117)
BUN: 14 mg/dL (ref 6–23)
CO2: 28 mEq/L (ref 19–32)
Calcium: 10.3 mg/dL (ref 8.4–10.5)
Chloride: 104 mEq/L (ref 96–112)
Creatinine, Ser: 0.74 mg/dL (ref 0.40–1.20)
GFR: 95.2 mL/min (ref >60.00)
Glucose, Bld: 105 mg/dL — ABNORMAL HIGH (ref 70–99)
Potassium: 4.7 mEq/L (ref 3.5–5.1)
Sodium: 144 mEq/L (ref 135–145)
Total Bilirubin: 0.7 mg/dL (ref 0.2–1.2)
Total Protein: 7.3 g/dL (ref 6.0–8.3)

## 2022-02-04 LAB — TSH: TSH: 1.1 u[IU]/mL (ref 0.35–5.50)

## 2022-02-04 NOTE — Progress Notes (Signed)
Phone [phone]  Subjective:   Patient is a 51 y.o. female presenting for annual physical.    Chief Complaint  Patient presents with   Annual Exam    Fasting W/ Labs, no concerns    See problem oriented charting- ROS- full  review of systems was completed and negative   The following were reviewed and entered/updated in epic: History reviewed. No pertinent past medical history. Patient Active Problem List   Diagnosis Date Noted   Mixed hyperlipidemia 01/09/2022   Sebaceous cyst 01/29/2011   Past Surgical History:  Procedure Laterality Date   INTRAUTERINE DEVICE INSERTION  05/2012   Mirena    Family History  Problem Relation Age of Onset   Hypertension Maternal Grandmother    Hypertension Maternal Grandfather    Breast cancer Paternal Aunt 34   Breast cancer Paternal Grandmother 53   Hypertension Father    Atrial fibrillation Father     Medications- reviewed and updated Current Outpatient Medications  Medication Sig Dispense Refill   levonorgestrel (MIRENA) 20 MCG/24HR IUD 1 each by Intrauterine route once.     rosuvastatin (CRESTOR) 10 MG tablet Take 1 tablet (10 mg total) by mouth daily. (Patient not taking: Reported on 02/04/2022) 30 tablet 6   No current facility-administered medications for this visit.    Allergies-reviewed and updated Allergies  Allergen Reactions   Iodine Rash    Social History   Social History Narrative   Not on file    Objective:  BP 105/72 (BP Location: Left Arm, Patient Position: Sitting, Cuff Size: Large)   Pulse 65   Temp 98.3 F (36.8 C) (Temporal)   Ht 5\' 4"  (1.626 m)   Wt 148 lb 8 oz (67.4 kg)   LMP  (LMP Unknown)   SpO2 99%   BMI 25.49 kg/m  Physical Exam Vitals and nursing note reviewed.  Constitutional:      Appearance: Normal appearance.  HENT:     Head: Normocephalic.     Right Ear: Tympanic membrane normal.     Left Ear: Tympanic membrane normal.     Nose: Nose normal.     Mouth/Throat:      Mouth: Mucous membranes are moist.  Eyes:     Pupils: Pupils are equal, round, and reactive to light.  Cardiovascular:     Rate and Rhythm: Normal rate and regular rhythm.  Pulmonary:     Effort: Pulmonary effort is normal.     Breath sounds: Normal breath sounds.  Musculoskeletal:        General: Normal range of motion.     Cervical back: Normal range of motion.  Lymphadenopathy:     Cervical: No cervical adenopathy.  Skin:    General: Skin is warm and dry.  Neurological:     Mental Status: She is alert.  Psychiatric:        Mood and Affect: Mood normal.        Behavior: Behavior normal.      Assessment and Plan   Health Maintenance counseling: 1. Anticipatory guidance: Patient counseled regarding regular dental exams q6 months, eye exams,  avoiding smoking and second hand smoke, limiting alcohol to 1 beverage per day, no illicit drugs.   2. Risk factor reduction:  Advised patient of need for regular exercise and diet rich with fruits and vegetables to reduce risk of heart attack and stroke. Exercise- none currently, planning to rejoin gym next month.  Wt Readings from Last 3 Encounters:  02/04/22 148 lb 8 oz (67.4  kg)  01/09/22 145 lb 2 oz (65.8 kg)  03/11/21 142 lb (64.4 kg)   3. Immunizations/screenings/ancillary studies Immunization History  Administered Date(s) Administered   PFIZER(Purple Top)SARS-COV-2 Vaccination 12/20/2019, 02/04/2020   Tdap 09/27/2019   Health Maintenance Due  Topic Date Due   HIV Screening  Never done   Hepatitis C Screening  Never done   COLONOSCOPY (Pts 45-24yrs Insurance coverage will need to be confirmed)  Never done   INFLUENZA VACCINE  01/20/2022    4. Cervical cancer screening- 2021 5. Breast cancer screening-  mammogram: will schedule this week 6. Colon cancer screening - wants to wait until next year 7. Skin cancer screening- advised regular sunscreen use. Denies worrisome, changing, or new skin lesions.  8. Birth control/STD  check- N/A 9. Osteoporosis screening- N/A 10. Alcohol screening: 1-2/week 11. Smoking associated screening (lung cancer screening, AAA screen 65-75, UA)- non- smoker  Problem List Items Addressed This Visit       Other   Mixed hyperlipidemia   Relevant Orders   Apolipoprotein B   Apolipoprotein A-1   Other Visit Diagnoses     Annual physical exam    -  Primary   Relevant Orders   Comprehensive metabolic panel   TSH   Lipid panel   CBC with Differential/Platelet       Recommended follow up: Return for any future concerns. No future appointments.  Lab/Order associations:fasting   Dulce Sellar, NP

## 2022-02-04 NOTE — Patient Instructions (Signed)
It was very nice to see you today!   I will review your lab results via MyChart in a few days.   Have a great trip to [facility] :-)    PLEASE NOTE:  If you had any lab tests please let us know if you have not heard back within a few days. You may see your results on MyChart before we have a chance to review them but we will give you a call once they are reviewed by [place]. If we ordered any referrals today, please let us know if you have not heard from their office within the next week.

## 2022-02-05 ENCOUNTER — Other Ambulatory Visit: Payer: Self-pay | Admitting: Family

## 2022-02-05 NOTE — Progress Notes (Signed)
Your glucose is slightly high, try to reduce total carbs including sweets. Your electrolytes, blood count, thyroid, liver & kidney function are all normal.  Your total cholesterol number & LDL (bad #) are high. Your HDL (good #) improved which is helping your ratio stay in range. You are still a low risk for a negative cardiac event in the next 10 years due to other factors in your favor, but if it is hard to change your diet, you may want to consider a low dose of Crestor once or twice a week.  Need to reduce any fried foods, alcohol, nonnutritional snacks e.g. chips/cookies,pies, cakes and candies, fatty meat (red meat), high fat dairy foods:  including cheese, milk, ice cream.  Increase fruits/vegetables/fiber.   Continue or restart an exercise routine, shooting for 5-7days per week.   Recommend repeating fasting lipids (no food or drink except black coffee or water after midnight) in 6 months with an office visit.

## 2022-02-06 LAB — APOLIPOPROTEIN A-1: Apolipoprotein A-1: 170 mg/dL (ref 116–209)

## 2022-02-06 LAB — APOLIPOPROTEIN B: Apolipoprotein B: 127 mg/dL — ABNORMAL HIGH (ref <90)

## 2022-02-10 ENCOUNTER — Other Ambulatory Visit: Payer: Self-pay | Admitting: Nurse Practitioner

## 2022-02-10 DIAGNOSIS — Z1231 Encounter for screening mammogram for malignant neoplasm of breast: Secondary | ICD-10-CM

## 2022-02-10 NOTE — Progress Notes (Signed)
Hi [name],  Just wanted to follow up on the Apolipoprotein B test that is elevated. This value indicates you have a higher risk for heart disease with increased LDL numbers. There are varying normal ranges for this test, and when broken down for females, <130 is normal, also it is often associated with higher triglycerides, but yours are in normal range.  Because you have no other cardiac risk factors, I recommend just keeping an eye on your LDL number and continuing to try and modify your diet as much as you are able, no need to start a medication right now. Any questions, let me know!

## 2022-03-11 ENCOUNTER — Ambulatory Visit: Payer: Managed Care, Other (non HMO)

## 2022-03-11 ENCOUNTER — Ambulatory Visit: Payer: Managed Care, Other (non HMO) | Admitting: Nurse Practitioner

## 2022-03-16 ENCOUNTER — Encounter: Payer: Self-pay | Admitting: *Deleted

## 2022-03-17 NOTE — Progress Notes (Unsigned)
   [name] 28-Jun-1970 [id number]   History:  51 y.o. [id number] presents for annual exam. Mirena IUD 09/2019, amenorrheic. Normal pap and mammogram history.   Gynecologic History No LMP recorded. (Menstrual status: IUD).   Contraception/Family planning: IUD Sexually active: Yes  Health Maintenance Last Pap: 09/27/2019. Results were: Normal neg HPV Last mammogram: 03/18/2022 (today). Results were: No report yet Last colonoscopy: Never Last Dexa: Not indicated  Past medical history, past surgical history, family history and social history were all reviewed and documented in the EPIC chart. Married.  2 children. PGM and paternal aunt with history of breast cancer.   ROS:  A ROS was performed and pertinent positives and negatives are included.  Exam:  There were no vitals filed for this visit.  There is no height or weight on file to calculate BMI.  General appearance:  Normal Thyroid:  Symmetrical, normal in size, without palpable masses or nodularity. Respiratory  Auscultation:  Clear without wheezing or rhonchi Cardiovascular  Auscultation:  Regular rate, without rubs, murmurs or gallops  Edema/varicosities:  Not grossly evident Abdominal  Soft,nontender, without masses, guarding or rebound.  Liver/spleen:  No organomegaly noted  Hernia:  None appreciated  Skin  Inspection:  Grossly normal Breasts: Examined lying and sitting.   Right: Without masses, retractions, nipple discharge or axillary adenopathy.   Left: Without masses, retractions, nipple discharge or axillary adenopathy. Genitourinary   Inguinal/mons:  Normal without inguinal adenopathy  External genitalia:  Normal appearing vulva with no masses, tenderness, or lesions  BUS/Urethra/Skene's glands:  Normal  Vagina:  Normal appearing with normal color and discharge, no lesions  Cervix:  Normal appearing without discharge or lesions. IUD string not visualized at exocervix  Uterus:  Normal in size, shape and  contour.  Midline and mobile, nontender  Adnexa/parametria:     Rt: Normal in size, without masses or tenderness.   Lt: Normal in size, without masses or tenderness.  Anus and perineum: Normal  Digital rectal exam: Normal sphincter tone without palpated masses or tenderness  Patient informed chaperone available to be present for breast and pelvic exam. Patient has requested no chaperone to be present. Patient has been advised what will be completed during breast and pelvic exam.   Assessment/Plan:  51 y.o. [id number] for annual exam.   Well female exam with routine gynecological exam - Education provided on SBEs, importance of preventative screenings, current guidelines, high calcium diet, regular exercise, and multivitamin daily. Labs with PCP in August.   Encounter for routine checking of intrauterine contraceptive device (IUD) - Amenorrheic. Mirena IUD inserted 09/2019.   Screening for cervical cancer - Normal Pap history.  Will repeat at 5-year interval per guidelines.  Screening for breast cancer - Normal mammogram history.  Continue annual screenings.  Normal breast exam today.  Screening for colon cancer - Has not had screening colonoscopy. Discussed current guidelines and importance of preventative screenings. Cologuard versus colonoscopy discussed.    Return in 1 year for annual.     Tamela Gammon DNP, 2:49 PM 03/17/2022

## 2022-03-18 ENCOUNTER — Encounter: Payer: Self-pay | Admitting: Nurse Practitioner

## 2022-03-18 ENCOUNTER — Ambulatory Visit: Payer: Managed Care, Other (non HMO)

## 2022-03-18 ENCOUNTER — Ambulatory Visit (INDEPENDENT_AMBULATORY_CARE_PROVIDER_SITE_OTHER): Payer: Managed Care, Other (non HMO) | Admitting: Nurse Practitioner

## 2022-03-18 VITALS — BP 116/72 | Ht 64.5 in | Wt 147.0 lb

## 2022-03-18 DIAGNOSIS — Z30431 Encounter for routine checking of intrauterine contraceptive device: Secondary | ICD-10-CM | POA: Diagnosis not present

## 2022-03-18 DIAGNOSIS — N951 Menopausal and female climacteric states: Secondary | ICD-10-CM

## 2022-03-18 DIAGNOSIS — Z01419 Encounter for gynecological examination (general) (routine) without abnormal findings: Secondary | ICD-10-CM | POA: Diagnosis not present

## 2022-03-18 MED ORDER — VEOZAH 45 MG PO TABS: 1.0000 | ORAL_TABLET | Freq: Every day | ORAL | 1 refills | Status: DC

## 2022-03-23 ENCOUNTER — Ambulatory Visit
Admission: RE | Admit: 2022-03-23 | Discharge: 2022-03-23 | Disposition: A | Discharge status: Home / Self Care | Payer: Managed Care, Other (non HMO) | Source: Ambulatory Visit | Attending: Nurse Practitioner | Admitting: Nurse Practitioner

## 2022-03-23 DIAGNOSIS — Z1231 Encounter for screening mammogram for malignant neoplasm of breast: Secondary | ICD-10-CM

## 2022-03-24 ENCOUNTER — Other Ambulatory Visit: Payer: Self-pay | Admitting: Nurse Practitioner

## 2022-03-24 DIAGNOSIS — R928 Other abnormal and inconclusive findings on diagnostic imaging of breast: Secondary | ICD-10-CM

## 2022-03-26 ENCOUNTER — Ambulatory Visit
Admission: RE | Admit: 2022-03-26 | Discharge: 2022-03-26 | Disposition: A | Discharge status: Home / Self Care | Payer: Managed Care, Other (non HMO) | Source: Ambulatory Visit | Attending: Nurse Practitioner | Admitting: Nurse Practitioner

## 2022-03-26 ENCOUNTER — Ambulatory Visit: Payer: Managed Care, Other (non HMO)

## 2022-03-26 DIAGNOSIS — R928 Other abnormal and inconclusive findings on diagnostic imaging of breast: Secondary | ICD-10-CM

## 2022-06-04 ENCOUNTER — Encounter: Payer: Self-pay | Admitting: *Deleted

## 2023-04-22 ENCOUNTER — Other Ambulatory Visit: Payer: Self-pay | Admitting: Family

## 2023-04-22 DIAGNOSIS — Z1231 Encounter for screening mammogram for malignant neoplasm of breast: Secondary | ICD-10-CM

## 2023-05-05 ENCOUNTER — Ambulatory Visit (INDEPENDENT_AMBULATORY_CARE_PROVIDER_SITE_OTHER): Payer: Managed Care, Other (non HMO) | Admitting: Family

## 2023-05-05 ENCOUNTER — Encounter: Payer: Self-pay | Admitting: Family

## 2023-05-05 VITALS — BP 122/83 | Temp 98.0°F | Ht 64.5 in | Wt 154.2 lb

## 2023-05-05 DIAGNOSIS — Z23 Encounter for immunization: Secondary | ICD-10-CM | POA: Diagnosis not present

## 2023-05-05 DIAGNOSIS — Z0000 Encounter for general adult medical examination without abnormal findings: Secondary | ICD-10-CM

## 2023-05-05 DIAGNOSIS — E782 Mixed hyperlipidemia: Secondary | ICD-10-CM | POA: Diagnosis not present

## 2023-05-05 DIAGNOSIS — Z1159 Encounter for screening for other viral diseases: Secondary | ICD-10-CM

## 2023-05-05 DIAGNOSIS — Z114 Encounter for screening for human immunodeficiency virus [HIV]: Secondary | ICD-10-CM

## 2023-05-05 DIAGNOSIS — Z1211 Encounter for screening for malignant neoplasm of colon: Secondary | ICD-10-CM

## 2023-05-05 NOTE — Patient Instructions (Addendum)
It was very nice to see you today!   I will review your lab results via MyChart in a few days.  I have sent the order for Cologuard, they should be in touch with you and will send the testing kit directly to your home.  Schedule a nurse visit only for your 2nd Shingrix vaccine in 2 mos, no later than 6 mos.  Have a great week!     PLEASE NOTE:  If you had any lab tests please let us know if you have not heard back within a few days. You may see your results on MyChart before we have a chance to review them but we will give you a call once they are reviewed by [place]. If we ordered any referrals today, please let us know if you have not heard from their office within the next week.

## 2023-05-05 NOTE — Progress Notes (Signed)
Phone [phone]  Subjective:   Patient is a 52 y.o. female presenting for annual physical.    Chief Complaint  Patient presents with   Annual Exam    Fasting w/ labs   Discussed the use of AI scribe software for clinical note transcription with the patient, who gave verbal consent to proceed.  History of Present Illness   [name], a patient with a history of high LDL cholesterol and a family history of heart disease, presents for routine check-up and lab work. The patient has fasted in preparation for the labs. The patient mentions feeling unwell due to cold medicine and has a mammogram scheduled for the next day. The patient has been vaccinated for the flu and is due for a Cologuard test, HIV and Hepatitis C screening. The patient also requests a shingles vaccine, having had a history of shingles in her thirties. The patient also mentions having been on Veozah for hot flashes but had to stop due to the cost. The patient is also on Mirena.     See problem oriented charting- ROS- full  review of systems was completed and negative  The following were reviewed and entered/updated in epic: No past medical history on file. Patient Active Problem List   Diagnosis Date Noted   Mixed hyperlipidemia 01/09/2022   Sebaceous cyst 01/29/2011   Past Surgical History:  Procedure Laterality Date   INTRAUTERINE DEVICE INSERTION     Mirena inserted 09/2019    Family History  Problem Relation Age of Onset   Hypertension Maternal Grandmother    Hypertension Maternal Grandfather    Breast cancer Paternal Aunt 10   Breast cancer Paternal Grandmother 42   Hypertension Father    Atrial fibrillation Father     Medications- reviewed and updated Current Outpatient Medications  Medication Sig Dispense Refill   levonorgestrel (MIRENA) 20 MCG/24HR IUD 1 each by Intrauterine route once. Inserted 09/2019     Fezolinetant (VEOZAH) 45 MG TABS Take 1 tablet by mouth daily. (Patient not taking:  Reported on 05/05/2023) 90 tablet 1   No current facility-administered medications for this visit.    Allergies-reviewed and updated Allergies  Allergen Reactions   Iodine Rash    Social History   Social History Narrative   Not on file    Objective:  BP 122/83 (BP Location: Left Arm, Patient Position: Sitting, Cuff Size: Normal)   Temp 98 F (36.7 C) (Temporal)   Ht 5' 4.5" (1.638 m)   Wt 154 lb 3.2 oz (69.9 kg)   SpO2 98%   BMI 26.06 kg/m  Physical Exam Vitals and nursing note reviewed.  Constitutional:      Appearance: Normal appearance.  HENT:     Head: Normocephalic.     Right Ear: Tympanic membrane normal.     Left Ear: Tympanic membrane normal.     Nose: Nose normal.     Mouth/Throat:     Mouth: Mucous membranes are moist.  Eyes:     Pupils: Pupils are equal, round, and reactive to light.  Cardiovascular:     Rate and Rhythm: Normal rate and regular rhythm.  Pulmonary:     Effort: Pulmonary effort is normal.     Breath sounds: Normal breath sounds.  Musculoskeletal:        General: Normal range of motion.     Cervical back: Normal range of motion.  Lymphadenopathy:     Cervical: No cervical adenopathy.  Skin:    General: Skin is warm and dry.  Neurological:     Mental Status: She is alert.  Psychiatric:        Mood and Affect: Mood normal.        Behavior: Behavior normal.      Assessment and Plan   Health Maintenance counseling: 1. Anticipatory guidance: Patient counseled regarding regular dental exams q6 months, eye exams,  avoiding smoking and second hand smoke, limiting alcohol to 1 beverage per day, no illicit drugs.   2. Risk factor reduction:  Advised patient of need for regular exercise and diet rich with fruits and vegetables to reduce risk of heart attack and stroke. Exercise- none currently, planning to rejoin gym next month.  Wt Readings from Last 3 Encounters:  05/05/23 154 lb 3.2 oz (69.9 kg)  03/18/22 147 lb (66.7 kg)  02/04/22  148 lb 8 oz (67.4 kg)   3. Immunizations/screenings/ancillary studies Immunization History  Administered Date(s) Administered   Influenza, Seasonal, Injecte, Preservative Fre 05/05/2023   PFIZER(Purple Top)SARS-COV-2 Vaccination 12/20/2019, 02/04/2020   Tdap 09/27/2019   Zoster Recombinant(Shingrix) 05/05/2023   Health Maintenance Due  Topic Date Due   HIV Screening  Never done   Hepatitis C Screening  Never done   Colonoscopy  Never done   MAMMOGRAM  03/24/2023    4. Cervical cancer screening- done 2021 - pt has upcoming GYN appt in Jan 5. Breast cancer screening-  mammogram: will schedule this week 6. Colon cancer screening - ordering cologuard today 7. Skin cancer screening- advised regular sunscreen use. Denies worrisome, changing, or new skin lesions.  8. Birth control/STD check- Mirena  9. Osteoporosis screening- N/A 10. Alcohol screening: 1-2/week 11. Smoking associated screening (lung cancer screening, AAA screen 65-75, UA)- non- smoker  Assessment and Plan    Hyperlipidemia - LDL slightly elevated last year, total cholesterol slightly elevated, triglycerides normal. Family history of hyperlipidemia. -Repeat fasting lipid panel today.  Colorectal Cancer Screening - No previous screening test. -Order Cologuard test.  Menopausal Symptoms - Previously used Vexazah with good effect but discontinued due to cost. -No change in management at this time. -F/U with GYN first of year, discuss removal of IUD.  Annual physical -  Mammogram scheduled for the next day. -Ordering lipid panel, CBC, CMP today, pt fasting. -Order HIV and Hepatitis C screening. - Flu shot administered. -Administered Shingrix shingles vaccine, first dose.  -F/U in 2-70m for 2nd Shingrix vaccine.     Recommended follow up: Return for 2-6 mos for 2nd shingrix vaccine. Future Appointments  Date Time Provider Department Center  05/06/2023  4:50 PM [facility] 3 [facility]  07/14/2023  9:00  AM Olivia Mackie, NP [facility] None   Lab/Order associations: fasting   Dulce Sellar, NP

## 2023-05-06 ENCOUNTER — Ambulatory Visit
Admission: RE | Admit: 2023-05-06 | Discharge: 2023-05-06 | Disposition: A | Discharge status: Home / Self Care | Payer: Managed Care, Other (non HMO) | Source: Ambulatory Visit

## 2023-05-06 DIAGNOSIS — Z1231 Encounter for screening mammogram for malignant neoplasm of breast: Secondary | ICD-10-CM

## 2023-05-06 LAB — CBC WITH DIFFERENTIAL/PLATELET
Basophils Absolute: 0.1 10*3/uL (ref 0.0–0.1)
Basophils Relative: 1.5 % (ref 0.0–3.0)
Eosinophils Absolute: 0 10*3/uL (ref 0.0–0.7)
Eosinophils Relative: 0.5 % (ref 0.0–5.0)
HCT: 43.3 % (ref 36.0–46.0)
Hemoglobin: 14.7 g/dL (ref 12.0–15.0)
Lymphocytes Relative: 19.4 % (ref 12.0–46.0)
Lymphs Abs: 1.1 10*3/uL (ref 0.7–4.0)
MCHC: 33.9 g/dL (ref 30.0–36.0)
MCV: 96.8 fL (ref 78.0–100.0)
Monocytes Absolute: 0.3 10*3/uL (ref 0.1–1.0)
Monocytes Relative: 5.8 % (ref 3.0–12.0)
Neutro Abs: 4.2 10*3/uL (ref 1.4–7.7)
Neutrophils Relative %: 72.8 % (ref 43.0–77.0)
Platelets: 265 10*3/uL (ref 150.0–400.0)
RBC: 4.47 Mil/uL (ref 3.87–5.11)
RDW: 12.6 % (ref 11.5–15.5)
WBC: 5.7 10*3/uL (ref 4.0–10.5)

## 2023-05-06 LAB — COMPREHENSIVE METABOLIC PANEL
ALT: 23 U/L (ref 0–35)
AST: 23 U/L (ref 0–37)
Albumin: 4.7 g/dL (ref 3.5–5.2)
Alkaline Phosphatase: 51 U/L (ref 39–117)
BUN: 13 mg/dL (ref 6–23)
CO2: 28 meq/L (ref 19–32)
Calcium: 9.6 mg/dL (ref 8.4–10.5)
Chloride: 103 meq/L (ref 96–112)
Creatinine, Ser: 0.72 mg/dL (ref 0.40–1.20)
GFR: 97.53 mL/min (ref >60.00)
Glucose, Bld: 81 mg/dL (ref 70–99)
Potassium: 4 meq/L (ref 3.5–5.1)
Sodium: 140 meq/L (ref 135–145)
Total Bilirubin: 0.6 mg/dL (ref 0.2–1.2)
Total Protein: 7.5 g/dL (ref 6.0–8.3)

## 2023-05-06 LAB — LIPID PANEL
Cholesterol: 244 mg/dL — ABNORMAL HIGH (ref 0–200)
HDL: 44.1 mg/dL (ref >39.00)
LDL Cholesterol: 181 mg/dL — ABNORMAL HIGH (ref 0–99)
NonHDL: 200.28
Total CHOL/HDL Ratio: 6
Triglycerides: 97 mg/dL (ref 0.0–149.0)
VLDL: 19.4 mg/dL (ref 0.0–40.0)

## 2023-05-06 LAB — HEPATITIS C ANTIBODY: Hepatitis C Ab: NONREACTIVE

## 2023-05-06 LAB — HIV ANTIBODY (ROUTINE TESTING W REFLEX): HIV 1&2 Ab, 4th Generation: NONREACTIVE

## 2023-05-21 LAB — COLOGUARD: COLOGUARD: NEGATIVE

## 2023-07-14 ENCOUNTER — Ambulatory Visit: Payer: Managed Care, Other (non HMO) | Admitting: Nurse Practitioner

## 2023-07-14 ENCOUNTER — Encounter: Payer: Self-pay | Admitting: Nurse Practitioner

## 2023-07-14 VITALS — BP 110/78 | HR 72 | Ht 65.0 in | Wt 156.0 lb

## 2023-07-14 DIAGNOSIS — N951 Menopausal and female climacteric states: Secondary | ICD-10-CM | POA: Diagnosis not present

## 2023-07-14 DIAGNOSIS — Z01419 Encounter for gynecological examination (general) (routine) without abnormal findings: Secondary | ICD-10-CM | POA: Diagnosis not present

## 2023-07-14 DIAGNOSIS — Z30431 Encounter for routine checking of intrauterine contraceptive device: Secondary | ICD-10-CM | POA: Diagnosis not present

## 2023-07-14 DIAGNOSIS — Z780 Asymptomatic menopausal state: Secondary | ICD-10-CM

## 2023-07-14 NOTE — Progress Notes (Signed)
[name] 1970/11/24 [id number]   History:  53 y.o. [id number] presents for annual exam. Mirena IUD 09/2019, amenorrheic. Mild hot flashes and night sweats. Did great on Veozah last year but too costly. Normal pap history.   Gynecologic History No LMP recorded. (Menstrual status: IUD).   Contraception/Family planning: IUD Sexually active: Yes  Health Maintenance Last Pap: 09/27/2019. Results were: Normal neg HPV Last mammogram: 05/06/2023. Results were: Normal Last colonoscopy: Never. 05/12/2023 neg Cologuard Last Dexa: Not indicated  Past medical history, past surgical history, family history and social history were all reviewed and documented in the EPIC chart. Married. Works in Chief Financial Officer. Husband is Art gallery manager. Daughter is senior at [employer] for [employer]. Son is Printmaker at [employer], Surveyor, minerals. PGM and paternal aunt with history of breast cancer.   ROS:  A ROS was performed and pertinent positives and negatives are included.  Exam:  Vitals:   07/14/23 0851  BP: 110/78  Pulse: 72  SpO2: 98%  Weight: 156 lb (70.8 kg)  Height: 5\' 5"  (1.651 m)     Body mass index is 25.96 kg/m.  General appearance:  Normal Thyroid:  Symmetrical, normal in size, without palpable masses or nodularity. Respiratory  Auscultation:  Clear without wheezing or rhonchi Cardiovascular  Auscultation:  Regular rate, without rubs, murmurs or gallops  Edema/varicosities:  Not grossly evident Abdominal  Soft,nontender, without masses, guarding or rebound.  Liver/spleen:  No organomegaly noted  Hernia:  None appreciated  Skin  Inspection:  Grossly normal Breasts: Examined lying and sitting.   Right: Without masses, retractions, nipple discharge or axillary adenopathy.   Left: Without masses, retractions, nipple discharge or axillary adenopathy. Pelvic: External genitalia:  no lesions              Urethra:  normal appearing urethra with no masses, tenderness or lesions               Bartholins and Skenes: normal                 Vagina: normal appearing vagina with normal color and discharge, no lesions              Cervix: Normal appearing without discharge or lesions. IUD string not visualized at exocervix, subsequent encounter Bimanual Exam:  Uterus:  no masses or tenderness              Adnexa: no mass, fullness, tenderness              Rectovaginal: Deferred              Anus:  normal, no lesions  Patient informed chaperone available to be present for breast and pelvic exam. Patient has requested no chaperone to be present. Patient has been advised what will be completed during breast and pelvic exam.   Assessment/Plan:  53 y.o. [id number] for annual exam.   Well female exam with routine gynecological exam - Education provided on SBEs, importance of preventative screenings, current guidelines, high calcium diet, regular exercise, and multivitamin daily. Labs with PCP.   Encounter for routine checking of intrauterine contraceptive device (IUD) - Amenorrheic. Mirena IUD inserted 09/2019.   Postmenopausal - no HRT. Mild hot flashes and night sweats. Did great on Veozah but was too costly. Will reach out if she decides to restart.   Screening for cervical cancer - Normal Pap history.  Will repeat at 5-year interval per guidelines.  Screening for breast cancer - Normal mammogram history.  Continue annual screenings.  Normal breast exam today.  Screening for colon cancer - Negative Cologuard 04/2023  Return in about 1 year (around 07/13/2024) for Annual.    Olivia Mackie DNP, 9:13 AM 07/14/2023

## 2023-07-28 ENCOUNTER — Ambulatory Visit (INDEPENDENT_AMBULATORY_CARE_PROVIDER_SITE_OTHER): Payer: Managed Care, Other (non HMO) | Admitting: *Deleted

## 2023-07-28 DIAGNOSIS — Z23 Encounter for immunization: Secondary | ICD-10-CM

## 2023-07-28 NOTE — Progress Notes (Signed)
 Patient is in office today for a nurse visit for  shingrix  Immunization. Shot given in the right deltoid. Tolerated well.

## 2023-11-01 ENCOUNTER — Ambulatory Visit
Admission: RE | Admit: 2023-11-01 | Discharge: 2023-11-01 | Disposition: A | Discharge status: Home / Self Care | Source: Ambulatory Visit | Attending: Family Medicine | Admitting: Family Medicine

## 2023-11-01 VITALS — BP 119/79 | HR 67 | Temp 98.7°F | Resp 19

## 2023-11-01 DIAGNOSIS — J039 Acute tonsillitis, unspecified: Secondary | ICD-10-CM

## 2023-11-01 DIAGNOSIS — K122 Cellulitis and abscess of mouth: Secondary | ICD-10-CM | POA: Diagnosis not present

## 2023-11-01 LAB — POC SARS CORONAVIRUS 2 AG -  ED: SARS Coronavirus 2 Ag: NEGATIVE

## 2023-11-01 LAB — POCT INFLUENZA A/B
Influenza A, POC: NEGATIVE
Influenza B, POC: NEGATIVE

## 2023-11-01 LAB — POCT RAPID STREP A (OFFICE): Rapid Strep A Screen: NEGATIVE

## 2023-11-01 MED ORDER — AMOXICILLIN-POT CLAVULANATE 875-125 MG PO TABS: 1.0000 | ORAL_TABLET | Freq: Two times a day (BID) | ORAL | 0 refills | Status: AC

## 2023-11-01 MED ORDER — PREDNISONE 20 MG PO TABS: ORAL_TABLET | ORAL | 0 refills | Status: AC

## 2023-11-01 NOTE — ED Triage Notes (Signed)
 Pt presents to [facility] with [facility] with cough congestion and sore throat since Friday. Pt is concerned for strep/flu/covid. Pt has been around others who were sick. Pt has taken otc cold and sinus medications.

## 2023-11-01 NOTE — ED Provider Notes (Signed)
 [name]    CSN: [id number] Arrival date & time: 11/01/23  0909      History   Chief Complaint Chief Complaint  Patient presents with   Sore Throat    Entered by patient   URI    HPI [name] is a 53 y.o. female.   HPI 53 year old female presents with URI-like symptoms, cough, congestion, and sore throat for 2 to 3 days.  Patient is concerned with strep, flu, COVID.  Patient reports being around other people who are sick.  PMH significant for mixed HLD.  History reviewed. No pertinent past medical history.  Patient Active Problem List   Diagnosis Date Noted   Mixed hyperlipidemia 01/09/2022   Sebaceous cyst 01/29/2011    Past Surgical History:  Procedure Laterality Date   INTRAUTERINE DEVICE INSERTION     Mirena  inserted 09/2019    OB History     Gravida  4   Para  2   Term      Preterm      AB  2   Living  2      SAB  2   IAB      Ectopic  0   Multiple      Live Births               Home Medications    Prior to Admission medications   Medication Sig Start Date End Date Taking? Authorizing Provider  amoxicillin-clavulanate (AUGMENTIN) 875-125 MG tablet Take 1 tablet by mouth every 12 (twelve) hours. 11/01/23  Yes Leonides Ramp, FNP  predniSONE (DELTASONE) 20 MG tablet Take 3 tabs PO daily x 5 days. 11/01/23  Yes Leonides Ramp, FNP  Ascorbic Acid (VITAMIN C PO) Take by mouth.    [provider]  FIBER PO Take by mouth.    [provider]  levonorgestrel  (MIRENA ) 20 MCG/24HR IUD 1 each by Intrauterine route once. Inserted 09/2019    [provider]  VITAMIN D PO Take by mouth.    [provider]    Family History Family History  Problem Relation Age of Onset   Hypertension Maternal Grandmother    Hypertension Maternal Grandfather    Breast cancer Paternal Aunt 107   Breast cancer Paternal Grandmother 19   Hypertension Father    Atrial fibrillation Father     Social  History Social History   Tobacco Use   Smoking status: Never   Smokeless tobacco: Never  Vaping Use   Vaping status: Never Used  Substance Use Topics   Alcohol use: Yes    Alcohol/week: 1.0 standard drink of alcohol    Types: 1 Standard drinks or equivalent per week    Comment: Rare   Drug use: No     Allergies   Iodine   Review of Systems Review of Systems  HENT:  Positive for congestion and sore throat.   All other systems reviewed and are negative.    Physical Exam Triage Vital Signs ED Triage Vitals  Encounter Vitals Group     BP      Systolic BP Percentile      Diastolic BP Percentile      Pulse      Resp      Temp      Temp src      SpO2      Weight      Height      Head Circumference      Peak Flow  Pain Score      Pain Loc      Pain Education      Exclude from Growth Chart    No data found.  Updated Vital Signs BP 119/79   Pulse 67   Temp 98.7 F (37.1 C)   Resp 19   SpO2 98%    Physical Exam Vitals and nursing note reviewed.  Constitutional:      Appearance: Normal appearance. She is well-developed and normal weight. She is ill-appearing.  HENT:     Head: Normocephalic and atraumatic.     Right Ear: Tympanic membrane, ear canal and external ear normal.     Left Ear: Tympanic membrane, ear canal and external ear normal.     Mouth/Throat:     Mouth: Mucous membranes are moist.     Pharynx: Oropharynx is clear. Uvula midline. Posterior oropharyngeal erythema and uvula swelling present.     Tonsils: 4+ on the right. 4+ on the left.  Eyes:     Extraocular Movements: Extraocular movements intact.     Conjunctiva/sclera: Conjunctivae normal.     Pupils: Pupils are equal, round, and reactive to light.  Cardiovascular:     Rate and Rhythm: Normal rate and regular rhythm.     Heart sounds: Normal heart sounds.  Pulmonary:     Breath sounds: No wheezing, rhonchi or rales.  Musculoskeletal:        General: Normal range of motion.      Cervical back: Normal range of motion and neck supple.  Skin:    General: Skin is warm and dry.  Neurological:     General: No focal deficit present.     Mental Status: She is alert and oriented to person, place, and time.      UC Treatments / Results  Labs (all labs ordered are listed, but only abnormal results are displayed) Labs Reviewed  CULTURE, GROUP A STREP [facility])  POCT INFLUENZA A/B  POCT RAPID STREP A (OFFICE)  POC SARS CORONAVIRUS 2 AG -  ED    EKG   Radiology No results found.  Procedures Procedures (including critical care time)  Medications Ordered in UC Medications - No data to display  Initial Impression / Assessment and Plan / UC Course  I have reviewed the triage vital signs and the nursing notes.  Pertinent labs & imaging results that were available during my care of the patient were reviewed by me and considered in my medical decision making (see chart for details).     MDM: 1.  Uvulitis-Rx'd Augmentin 875/125 mg tablet: Take 1 tablet twice daily x 7 days; 2.  Acute tonsillitis, unspecified etiology-Rx'd Augmentin 875/125 mg tablet: Take 1 tablet twice daily x 7 days Rx'd prednisone 20 mg tablet: Take 3 tabs p.o. daily x 5 days. Advised patient to take medications as directed with food to completion.  Advised patient to take prednisone with first dose of Augmentin for the next 5 of 7 days.  Patient to increase daily water intake to 64 ounces per day while taking these medications.  Advised if symptoms worsen and/or unresolved please follow-up with your PCP or here for further evaluation.  Patient discharged home, hemodynamically stable. Final Clinical Impressions(s) / UC Diagnoses   Final diagnoses:  Uvulitis  Acute tonsillitis, unspecified etiology     Discharge Instructions      Advised patient to take medications as directed with food to completion.  Advised patient to take prednisone with first dose of Augmentin for the  next 5 of 7 days.   Patient to increase daily water intake to 64 ounces per day while taking these medications.  Advised if symptoms worsen and/or unresolved please follow-up with your PCP or here for further evaluation.   ED Prescriptions     Medication Sig Dispense Auth. Provider   predniSONE (DELTASONE) 20 MG tablet Take 3 tabs PO daily x 5 days. 15 tablet Jaques Mineer, FNP   amoxicillin-clavulanate (AUGMENTIN) 875-125 MG tablet Take 1 tablet by mouth every 12 (twelve) hours. 14 tablet Elesha Thedford, FNP      PDMP not reviewed this encounter.   Leonides Ramp, FNP 11/01/23 1029

## 2023-11-01 NOTE — Discharge Instructions (Addendum)
 Advised patient to take medications as directed with food to completion.  Advised patient to take prednisone with first dose of Augmentin for the next 5 of 7 days.  Patient to increase daily water intake to 64 ounces per day while taking these medications.  Advised if symptoms worsen and/or unresolved please follow-up with your PCP or here for further evaluation.

## 2023-11-03 LAB — CULTURE, GROUP A STREP (THRC)

## 2024-07-18 ENCOUNTER — Ambulatory Visit: Admitting: Nurse Practitioner

## 2024-09-13 ENCOUNTER — Ambulatory Visit: Admitting: Nurse Practitioner
